# Patient Record
Sex: Female | Born: 2000
Health system: Southern US, Community
[De-identification: ages and names within clinical notes are randomized; demographics above are authoritative.]

## PROBLEM LIST (undated history)

## (undated) DIAGNOSIS — F419 Anxiety disorder, unspecified: Secondary | ICD-10-CM

## (undated) DIAGNOSIS — F938 Other childhood emotional disorders: Secondary | ICD-10-CM

## (undated) DIAGNOSIS — F32A Depression, unspecified: Secondary | ICD-10-CM

## (undated) DIAGNOSIS — F329 Major depressive disorder, single episode, unspecified: Secondary | ICD-10-CM

---
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
## 2018-04-12 DIAGNOSIS — Z3202 Encounter for pregnancy test, result negative: Secondary | ICD-10-CM | POA: Diagnosis not present

## 2018-04-12 DIAGNOSIS — Z01419 Encounter for gynecological examination (general) (routine) without abnormal findings: Secondary | ICD-10-CM | POA: Diagnosis not present

## 2018-04-12 DIAGNOSIS — Z6821 Body mass index (BMI) 21.0-21.9, adult: Secondary | ICD-10-CM | POA: Diagnosis not present

## 2018-04-12 DIAGNOSIS — Z113 Encounter for screening for infections with a predominantly sexual mode of transmission: Secondary | ICD-10-CM | POA: Diagnosis not present

## 2018-04-20 DIAGNOSIS — F902 Attention-deficit hyperactivity disorder, combined type: Secondary | ICD-10-CM | POA: Diagnosis not present

## 2018-04-20 DIAGNOSIS — F3175 Bipolar disorder, in partial remission, most recent episode depressed: Secondary | ICD-10-CM | POA: Diagnosis not present

## 2018-04-24 DIAGNOSIS — A549 Gonococcal infection, unspecified: Secondary | ICD-10-CM | POA: Diagnosis not present

## 2018-07-25 DIAGNOSIS — Z8619 Personal history of other infectious and parasitic diseases: Secondary | ICD-10-CM | POA: Diagnosis not present

## 2018-07-25 DIAGNOSIS — Z113 Encounter for screening for infections with a predominantly sexual mode of transmission: Secondary | ICD-10-CM | POA: Diagnosis not present

## 2018-07-25 DIAGNOSIS — N760 Acute vaginitis: Secondary | ICD-10-CM | POA: Diagnosis not present

## 2018-08-07 ENCOUNTER — Ambulatory Visit (INDEPENDENT_AMBULATORY_CARE_PROVIDER_SITE_OTHER): Payer: BC Managed Care – PPO | Admitting: Family Medicine

## 2018-08-07 ENCOUNTER — Other Ambulatory Visit: Payer: Self-pay

## 2018-08-07 VITALS — BP 129/86 | HR 76 | Temp 98.6°F | Ht 66.5 in | Wt 134.8 lb

## 2018-08-07 DIAGNOSIS — B070 Plantar wart: Secondary | ICD-10-CM | POA: Diagnosis not present

## 2018-08-07 NOTE — Progress Notes (Signed)
Acute Office Visit  Subjective:    Patient ID: Regina Contreras, female    DOB: September 22, 2000, 18 y.o.   MRN: [id number]  Chief Complaint  Patient presents with  . Plantar Warts    left foot    HPI Patient is in today for plantar wart on the left foot. No swelling. Pain with walking. Pt noted over the last 2 weeks. 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 No prior warts  Past Medical History:  Diagnosis Date  . Anxiety   . 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 Depressed     No past surgical history on file.  Family History  Problem Relation Age of Onset  . Hypertension Mother   . Cancer Maternal Grandfather        Leukemia/lymphoma    Social History   Socioeconomic History  . Marital status: Single    Spouse name: Not on file  . Number of children: Not on file  . Years of education: Not on file  . Highest education level: Not on file  Occupational History  . Not on file  Social Needs  . Financial resource strain: Not on file  . Food insecurity    Worry: Not on file    Inability: Not on file  . Transportation needs    Medical: Not on file    Non-medical: Not on file  Tobacco Use  . Smoking status: Never Smoker  . Smokeless tobacco: Never Used  Substance and Sexual Activity  . Alcohol use: No  . Drug use: No  . Sexual activity: Never    Birth control/protection: None  Lifestyle  . Physical activity    Days per week: Not on file    Minutes per session: Not on file  . Stress: Not on file  Relationships  . Social Herbalist on phone: Not on file    Gets together: Not on file    Attends religious service: Not on file    Active member of club or organization: Not on file    Attends meetings of clubs or organizations: Not on file    Relationship status: Not on file  . Intimate partner violence    Fear of current or ex partner: Not on file    Emotionally abused: Not on file    Physically abused: Not on file    Forced sexual  activity: Not on file  Other Topics Concern  . Not on file  Social History Narrative  . Not on file    Outpatient Medications Prior to Visit  Medication Sig Dispense Refill  . norelgestromin-ethinyl estradiol Marilu Favre) 150-35 MCG/24HR transdermal patch Place 1 patch onto the skin once a week.    Marland Kitchen ibuprofen (ADVIL,MOTRIN) 600 MG tablet Take 1 tablet (600 mg total) by mouth every 8 (eight) hours as needed. (Patient not taking: Reported on 05/08/2017) 30 tablet 0  . lamoTRIgine (LAMICTAL) 200 MG tablet Take 1 tablet (200 mg total) by mouth daily. 30 tablet 0  . methylphenidate 36 MG PO CR tablet Take 54 mg by mouth daily.     . cyclobenzaprine (FLEXERIL) 5 MG tablet Take 1 tablet (5 mg total) by mouth 3 (three) times daily as needed for muscle spasms. (Patient not taking: Reported on 05/08/2017) 30 tablet 1  . hydrOXYzine (ATARAX/VISTARIL) 25 MG tablet Take 25 mg by mouth at bedtime.    Marland Kitchen REXULTI 1 MG TABS TK 1 T PO QHS  2   No facility-administered  medications prior to visit.     Allergies  Allergen Reactions  . Prozac [Fluoxetine Hcl] Other (See Comments)    Negative personality change  . Vyvanse [Lisdexamfetamine Dimesylate] Other (See Comments)    Negative Personality change    ROS Wart left foot-tender    Objective:    Physical Exam  Constitutional: She appears well-developed and well-nourished.  left plantar foot-plantar wart  BP 129/86 (BP Location: Left Arm, Patient Position: Sitting, Cuff Size: Normal)   Pulse 76   Temp 98.6 F (37 C) (Oral)   Ht 5' 6.5" (1.689 m)   Wt 134 lb 12.8 oz (61.1 kg)   SpO2 100%   BMI 21.43 kg/m  Wt Readings from Last 3 Encounters:  08/07/18 134 lb 12.8 oz (61.1 kg) (62 %, Z= 0.31)*  05/08/17 134 lb 9.6 oz (61.1 kg) (67 %, Z= 0.44)*  04/13/17 133 lb 12.8 oz (60.7 kg) (66 %, Z= 0.42)*   * Growth percentiles are based on CDC (Girls, 2-20 Years) data.    Health Maintenance Due  Topic Date Due  . HIV Screening  01/03/2014  .  TETANUS/TDAP  01/03/2018    There are no preventive care reminders to display for this patient.   Lab Results  Component Value Date   TSH 1.490 09/02/2016   Lab Results  Component Value Date   WBC 5.6 09/02/2016   HGB 11.5 09/02/2016   HCT 34.9 09/02/2016   MCV 79 09/02/2016   PLT 258 09/02/2016   Lab Results  Component Value Date   NA 139 09/02/2016   K 3.9 09/02/2016   CO2 24 09/02/2016   GLUCOSE 90 09/02/2016   BUN 11 09/02/2016   CREATININE 0.87 09/02/2016   BILITOT 0.3 09/02/2016   ALKPHOS 78 09/02/2016   AST 18 09/02/2016   ALT 10 09/02/2016   PROT 7.1 09/02/2016   ALBUMIN 4.5 09/02/2016   CALCIUM 9.8 09/02/2016   ANIONGAP 6 11/28/2014   Lab Results  Component Value Date   CHOL 136 09/02/2016   Lab Results  Component Value Date   HDL 68 09/02/2016   Lab Results  Component Value Date   LDLCALC 56 09/02/2016   Lab Results  Component Value Date   TRIG 59 09/02/2016   Lab Results  Component Value Date   CHOLHDL 2.0 09/02/2016   Lab Results  Component Value Date   HGBA1C 5.5 09/02/2016       Assessment & Plan:  1. Plantar wart of left foot D/w pt procedure for removal-pt will wait until job over for the summer for removal. 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 Will schedule appt for removal-understands risk/benefit. Encouraged use of the corn pad until removal to decrease pain    Mat CarneLEIGH , MD

## 2018-08-09 DIAGNOSIS — B070 Plantar wart: Secondary | ICD-10-CM | POA: Insufficient documentation

## 2018-08-13 DIAGNOSIS — Z03818 Encounter for observation for suspected exposure to other biological agents ruled out: Secondary | ICD-10-CM | POA: Diagnosis not present

## 2018-10-08 DIAGNOSIS — B070 Plantar wart: Secondary | ICD-10-CM | POA: Diagnosis not present

## 2018-10-17 DIAGNOSIS — F902 Attention-deficit hyperactivity disorder, combined type: Secondary | ICD-10-CM | POA: Diagnosis not present

## 2018-10-17 DIAGNOSIS — F3175 Bipolar disorder, in partial remission, most recent episode depressed: Secondary | ICD-10-CM | POA: Diagnosis not present

## 2018-11-29 DIAGNOSIS — L03115 Cellulitis of right lower limb: Secondary | ICD-10-CM | POA: Diagnosis not present

## 2018-11-29 DIAGNOSIS — L02415 Cutaneous abscess of right lower limb: Secondary | ICD-10-CM | POA: Diagnosis not present

## 2018-12-03 DIAGNOSIS — L02415 Cutaneous abscess of right lower limb: Secondary | ICD-10-CM | POA: Diagnosis not present

## 2018-12-03 DIAGNOSIS — M79604 Pain in right leg: Secondary | ICD-10-CM | POA: Diagnosis not present

## 2018-12-08 DIAGNOSIS — Z20828 Contact with and (suspected) exposure to other viral communicable diseases: Secondary | ICD-10-CM | POA: Diagnosis not present

## 2020-04-20 ENCOUNTER — Other Ambulatory Visit: Payer: Self-pay

## 2020-04-20 ENCOUNTER — Encounter (HOSPITAL_BASED_OUTPATIENT_CLINIC_OR_DEPARTMENT_OTHER): Payer: Self-pay | Admitting: *Deleted

## 2020-04-20 ENCOUNTER — Observation Stay (HOSPITAL_BASED_OUTPATIENT_CLINIC_OR_DEPARTMENT_OTHER)
Admission: EM | Admit: 2020-04-20 | Discharge: 2020-04-21 | Disposition: A | Discharge status: Home / Self Care | Payer: 59 | Attending: Internal Medicine | Admitting: Internal Medicine

## 2020-04-20 ENCOUNTER — Emergency Department (HOSPITAL_BASED_OUTPATIENT_CLINIC_OR_DEPARTMENT_OTHER): Payer: 59

## 2020-04-20 ENCOUNTER — Emergency Department (HOSPITAL_COMMUNITY): Payer: 59

## 2020-04-20 DIAGNOSIS — K529 Noninfective gastroenteritis and colitis, unspecified: Secondary | ICD-10-CM

## 2020-04-20 DIAGNOSIS — E860 Dehydration: Secondary | ICD-10-CM | POA: Diagnosis not present

## 2020-04-20 DIAGNOSIS — J982 Interstitial emphysema: Secondary | ICD-10-CM | POA: Diagnosis not present

## 2020-04-20 DIAGNOSIS — Z20822 Contact with and (suspected) exposure to covid-19: Secondary | ICD-10-CM | POA: Insufficient documentation

## 2020-04-20 DIAGNOSIS — F17290 Nicotine dependence, other tobacco product, uncomplicated: Secondary | ICD-10-CM | POA: Diagnosis not present

## 2020-04-20 DIAGNOSIS — F1290 Cannabis use, unspecified, uncomplicated: Secondary | ICD-10-CM | POA: Diagnosis present

## 2020-04-20 DIAGNOSIS — Z79899 Other long term (current) drug therapy: Secondary | ICD-10-CM | POA: Insufficient documentation

## 2020-04-20 DIAGNOSIS — R112 Nausea with vomiting, unspecified: Principal | ICD-10-CM | POA: Insufficient documentation

## 2020-04-20 DIAGNOSIS — D72829 Elevated white blood cell count, unspecified: Secondary | ICD-10-CM | POA: Diagnosis present

## 2020-04-20 DIAGNOSIS — D649 Anemia, unspecified: Secondary | ICD-10-CM | POA: Insufficient documentation

## 2020-04-20 DIAGNOSIS — E876 Hypokalemia: Secondary | ICD-10-CM | POA: Diagnosis not present

## 2020-04-20 DIAGNOSIS — F1210 Cannabis abuse, uncomplicated: Secondary | ICD-10-CM | POA: Insufficient documentation

## 2020-04-20 LAB — CBC
HCT: 35.1 % — ABNORMAL LOW (ref 36.0–46.0)
Hemoglobin: 10.8 g/dL — ABNORMAL LOW (ref 12.0–15.0)
MCH: 24 pg — ABNORMAL LOW (ref 26.0–34.0)
MCHC: 30.8 g/dL (ref 30.0–36.0)
MCV: 78 fL — ABNORMAL LOW (ref 80.0–100.0)
Platelets: 452 10*3/uL — ABNORMAL HIGH (ref 150–400)
RBC: 4.5 MIL/uL (ref 3.87–5.11)
RDW: 16.2 % — ABNORMAL HIGH (ref 11.5–15.5)
WBC: 16.8 10*3/uL — ABNORMAL HIGH (ref 4.0–10.5)
nRBC: 0 % (ref 0.0–0.2)

## 2020-04-20 LAB — URINALYSIS, ROUTINE W REFLEX MICROSCOPIC
Bilirubin Urine: NEGATIVE
Glucose, UA: NEGATIVE mg/dL
Hgb urine dipstick: NEGATIVE
Ketones, ur: 15 mg/dL — AB
Leukocytes,Ua: NEGATIVE
Nitrite: NEGATIVE
Protein, ur: 30 mg/dL — AB
Specific Gravity, Urine: 1.023 (ref 1.005–1.030)
pH: 6.5 (ref 5.0–8.0)

## 2020-04-20 LAB — RESP PANEL BY RT-PCR (FLU A&B, COVID) ARPGX2
Influenza A by PCR: NEGATIVE
Influenza B by PCR: NEGATIVE
SARS Coronavirus 2 by RT PCR: NEGATIVE

## 2020-04-20 LAB — RAPID URINE DRUG SCREEN, HOSP PERFORMED
Amphetamines: NOT DETECTED
Barbiturates: NOT DETECTED
Benzodiazepines: NOT DETECTED
Cocaine: NOT DETECTED
Opiates: POSITIVE — AB
Tetrahydrocannabinol: POSITIVE — AB

## 2020-04-20 LAB — COMPREHENSIVE METABOLIC PANEL
ALT: 14 U/L (ref 0–44)
AST: 22 U/L (ref 15–41)
Albumin: 4.7 g/dL (ref 3.5–5.0)
Alkaline Phosphatase: 54 U/L (ref 38–126)
Anion gap: 14 (ref 5–15)
BUN: 13 mg/dL (ref 6–20)
CO2: 21 mmol/L — ABNORMAL LOW (ref 22–32)
Calcium: 9.8 mg/dL (ref 8.9–10.3)
Chloride: 105 mmol/L (ref 98–111)
Creatinine, Ser: 0.89 mg/dL (ref 0.44–1.00)
GFR, Estimated: >60 mL/min (ref >60)
Glucose, Bld: 174 mg/dL — ABNORMAL HIGH (ref 70–99)
Potassium: 3.2 mmol/L — ABNORMAL LOW (ref 3.5–5.1)
Sodium: 140 mmol/L (ref 135–145)
Total Bilirubin: 0.4 mg/dL (ref 0.3–1.2)
Total Protein: 7.7 g/dL (ref 6.5–8.1)

## 2020-04-20 LAB — PREGNANCY, URINE: Preg Test, Ur: NEGATIVE

## 2020-04-20 LAB — LIPASE, BLOOD: Lipase: 11 U/L (ref 11–51)

## 2020-04-20 MED ORDER — ONDANSETRON HCL 4 MG/2ML IJ SOLN
4.0000 mg | Freq: Once | INTRAMUSCULAR | Status: DC | PRN
  Filled 2020-04-20: qty 2

## 2020-04-20 MED ORDER — HYDROMORPHONE HCL 1 MG/ML IJ SOLN
0.5000 mg | Freq: Once | INTRAMUSCULAR | Status: AC
  Administered 2020-04-20: 0.5 mg via INTRAVENOUS
  Filled 2020-04-20: qty 1

## 2020-04-20 MED ORDER — ONDANSETRON HCL 4 MG/2ML IJ SOLN
4.0000 mg | Freq: Once | INTRAMUSCULAR | Status: AC
  Filled 2020-04-20: qty 2

## 2020-04-20 MED ORDER — HYDROMORPHONE HCL 1 MG/ML IJ SOLN: 1.0000 mg | Freq: Once | INTRAMUSCULAR | Status: DC

## 2020-04-20 MED ORDER — IOHEXOL 350 MG/ML SOLN
50.0000 mL | Freq: Once | INTRAVENOUS | Status: AC | PRN
  Administered 2020-04-20: 50 mL via ORAL

## 2020-04-20 MED ORDER — ALUM & MAG HYDROXIDE-SIMETH 200-200-20 MG/5ML PO SUSP
30.0000 mL | Freq: Once | ORAL | Status: AC
  Administered 2020-04-20: 30 mL via ORAL
  Filled 2020-04-20: qty 30

## 2020-04-20 MED ORDER — ONDANSETRON HCL 4 MG/2ML IJ SOLN
INTRAMUSCULAR | Status: AC
  Administered 2020-04-20: 4 mg
  Filled 2020-04-20: qty 2

## 2020-04-20 MED ORDER — SODIUM CHLORIDE 0.9 % IV SOLN
1.0000 g | Freq: Once | INTRAVENOUS | Status: AC
  Administered 2020-04-20: 1 g via INTRAVENOUS
  Filled 2020-04-20: qty 10

## 2020-04-20 MED ORDER — PANTOPRAZOLE SODIUM 40 MG IV SOLR
40.0000 mg | Freq: Once | INTRAVENOUS | Status: AC
  Administered 2020-04-20: 40 mg via INTRAVENOUS
  Filled 2020-04-20: qty 40

## 2020-04-20 MED ORDER — SODIUM CHLORIDE 0.9 % IV SOLN: INTRAVENOUS | Status: DC

## 2020-04-20 MED ORDER — IOHEXOL 300 MG/ML  SOLN
80.0000 mL | Freq: Once | INTRAMUSCULAR | Status: AC | PRN
  Administered 2020-04-20: 80 mL via INTRAVENOUS

## 2020-04-20 MED ORDER — LIDOCAINE VISCOUS HCL 2 % MT SOLN
15.0000 mL | Freq: Once | OROMUCOSAL | Status: AC
  Administered 2020-04-20: 15 mL via ORAL
  Filled 2020-04-20: qty 15

## 2020-04-20 MED ORDER — SODIUM CHLORIDE 0.9 % IV BOLUS
1000.0000 mL | Freq: Once | INTRAVENOUS | Status: AC
  Administered 2020-04-20: 1000 mL via INTRAVENOUS

## 2020-04-20 NOTE — H&P (Signed)
Regina Contreras ZOX:[id number] DOB: Dec 08, 2000 DOA: 04/20/2020    PCP: Porfirio OarJeffery, Chelle, PA   Outpatient Specialists: NONE    Patient arrived to ER on 04/20/20 at 1437 Referred by Attending Tegeler, Canary Brimhristopher J, *   Patient coming from: home Lives  With family    Chief Complaint:  Chief Complaint  Patient presents with  . Emesis  . Abdominal Pain    HPI: Regina Contreras is a 20 y.o. female with medical history significant of anxiety    Presented with abdominal pain which is somewhat diffuse.  Nausea and severe vomiting also diarrhea started at 11:30 AM lasting until 3PM severe dry heaves. 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 Was seen at [facility] noted to have vital signs stable but CT scan showed small amount of pneumomediastinum at GE junction General surgery was consulted [facility]  She vapes and smokes marijuana    Has  been vaccinated against COVID and boosted   Initial COVID TEST  NEGATIVE   Lab Results  Component Value Date   SARSCOV2NAA NEGATIVE 04/20/2020      While in ER: Initial CT showed pneumomediastinum  Was started on rocephine Esophagram showed no esophageal tear   ED Triage Vitals  Enc Vitals Group     BP 04/20/20 1445 (!) 162/89     Pulse Rate 04/20/20 1443 71     Resp 04/20/20 1443 16     Temp 04/20/20 1443 (!) 96.9 F (36.1 C)     Temp Source 04/20/20 1443 Oral     SpO2 04/20/20 1443 100 %     Weight 04/20/20 1444 125 lb (56.7 kg)     Height 04/20/20 1444 5\' 6"  (1.676 m)     Head Circumference --      Peak Flow --      Pain Score 04/20/20 1443 3     Pain Loc --      Pain Edu? --      Excl. in GC? --   TMAX(24)@     _________________________________________ Significant initial  Findings: Abnormal Labs Reviewed  COMPREHENSIVE METABOLIC PANEL - Abnormal;  Notable for the following components:      Result Value   Potassium 3.2 (*)    CO2 21 (*)    Glucose, Bld 174 (*)    All other components within normal limits  CBC - Abnormal; Notable for the following components:   WBC 16.8 (*)    Hemoglobin 10.8 (*)    HCT 35.1 (*)    MCV 78.0 (*)    MCH 24.0 (*)    RDW 16.2 (*)    Platelets 452 (*)    All other components within normal limits  URINALYSIS, ROUTINE W REFLEX MICROSCOPIC - Abnormal; Notable for the following components:   Ketones, ur 15 (*)    Protein, ur 30 (*)    All other components within normal limits  RAPID URINE DRUG SCREEN, HOSP PERFORMED - Abnormal; Notable for the following components:   Opiates POSITIVE (*)    Tetrahydrocannabinol POSITIVE (*)    All other components within normal limits    CTabd/pelvis -  pneumomediastinum    ECG: Ordered     The recent clinical data is shown below. Vitals:   04/20/20 1915 04/20/20 2048 04/20/20 2130 04/20/20 2220  BP: 127/83 140/84 131/72 135/86  Pulse: 61 61 (!) 57 (!) 57  Resp: 18 12 20 15   Temp:      TempSrc:      SpO2: 100% 100% 100% 100%  Weight:      Height:       WBC     Component Value Date/Time   WBC 16.8 (H) 04/20/2020 1452   LYMPHSABS 2.1 09/02/2016 1139   MONOABS 0.7 11/28/2014 1711   EOSABS 0.1 09/02/2016 1139   BASOSABS 0.0 09/02/2016 1139     UA  no evidence of UTI     Urine analysis:    Component Value Date/Time   COLORURINE YELLOW 04/20/2020 1606   APPEARANCEUR CLEAR 04/20/2020 1606   LABSPEC 1.023 04/20/2020 1606   PHURINE 6.5 04/20/2020 1606   GLUCOSEU NEGATIVE 04/20/2020 1606   HGBUR NEGATIVE 04/20/2020 1606   BILIRUBINUR NEGATIVE 04/20/2020 1606   KETONESUR 15 (A) 04/20/2020 1606   PROTEINUR 30 (A) 04/20/2020 1606   NITRITE NEGATIVE 04/20/2020 1606   LEUKOCYTESUR NEGATIVE 04/20/2020 1606    Results for orders placed or performed during the hospital encounter of 04/20/20  Resp Panel by RT-PCR (Flu A&B, Covid) Nasopharyngeal Swab      Status: None   Collection Time: 04/20/20  7:19 PM   Specimen: Nasopharyngeal Swab; Nasopharyngeal(NP) swabs in vial transport medium  Result Value Ref Range Status   SARS Coronavirus 2 by RT PCR NEGATIVE NEGATIVE Final         Influenza A by PCR NEGATIVE NEGATIVE Final   Influenza B by PCR NEGATIVE NEGATIVE Final          _______________________________________________________ ER Provider Called:   general surgery   Dr.Wakefield   They Recommend admit to medicine    _______________________________________________ Hospitalist was called for admission for pneumomediastinum dehydrtion  The following Work up has been ordered so far:  Orders Placed This Encounter  Procedures  . Resp Panel by RT-PCR (Flu A&B, Covid) Nasopharyngeal Swab  . CT Abdomen Pelvis W Contrast  . DG ESOPHAGUS W SINGLE CM (SOL OR THIN BA)  . Lipase, blood  . Comprehensive metabolic panel  . CBC  . Urinalysis, Routine w reflex microscopic  . Pregnancy, urine  . Rapid urine drug screen (hospital performed)  . Diet NPO time specified  . Secure IV for POV transfer  . Consult to general surgery  . Consult to hospitalist  . Consult to hospitalist     Following Medications were ordered in ER: Medications  0.9 %  sodium chloride infusion ( Intravenous New Bag/Given 04/20/20 1506)  0.9 %  sodium chloride infusion ( Intravenous New Bag/Given 04/20/20 2118)  ondansetron (ZOFRAN) 4 MG/2ML injection (4 mg  Given 04/20/20 1452)  sodium chloride 0.9 % bolus 1,000 mL (0 mLs Intravenous Stopped 04/20/20 1604)  HYDROmorphone (DILAUDID) injection 0.5 mg (0.5 mg Intravenous Given 04/20/20 1502)  alum & mag hydroxide-simeth (MAALOX/MYLANTA) 200-200-20 MG/5ML suspension 30 mL (30 mLs Oral Given 04/20/20 1620)    And  lidocaine (XYLOCAINE) 2 % viscous mouth solution 15 mL (15 mLs Oral Given 04/20/20 1620)  iohexol (OMNIPAQUE) 300 MG/ML solution 80 mL (80 mLs Intravenous Contrast Given 04/20/20 1647)  pantoprazole (PROTONIX) injection 40 mg  (40 mg Intravenous Given 04/20/20 2101)  cefTRIAXone (ROCEPHIN) 1 g in sodium chloride 0.9 % 100 mL IVPB (0 g Intravenous Stopped 04/20/20 2234)  HYDROmorphone (DILAUDID) injection 0.5 mg (0.5 mg Intravenous Given 04/20/20 2141)  iohexol (OMNIPAQUE) 350 MG/ML injection 50 mL (50 mLs Oral Contrast Given 04/20/20 2231)  Consult Orders  (From admission, onward)         Start     Ordered   04/20/20 2254  Consult to hospitalist  Once       Provider:  (Not yet assigned)  Question Answer Comment  Place call to: [facility]   Reason for Consult Admit      04/20/20 2253   04/20/20 1805  Consult to hospitalist  Spoke to Phil at [facility] [place] to repage [phone]  Once       Provider:  (Not yet assigned)  Question Answer Comment  Place call to: [facility]   Reason for Consult Admit      04/20/20 1804            OTHER Significant initial  Findings:  labs showing:    Recent Labs  Lab 04/20/20 1452  NA 140  K 3.2*  CO2 21*  GLUCOSE 174*  BUN 13  CREATININE 0.89  CALCIUM 9.8    Cr   stable,    Lab Results  Component Value Date   CREATININE 0.89 04/20/2020   CREATININE 0.87 09/02/2016   CREATININE 1.00 11/28/2014    Recent Labs  Lab 04/20/20 1452  AST 22  ALT 14  ALKPHOS 54  BILITOT 0.4  PROT 7.7  ALBUMIN 4.7   Lab Results  Component Value Date   CALCIUM 9.8 04/20/2020       Plt: Lab Results  Component Value Date   PLT 452 (H) 04/20/2020       COVID-19 Labs  No results for input(s): DDIMER, FERRITIN, LDH, CRP in the last 72 hours.  Lab Results  Component Value Date   SARSCOV2NAA NEGATIVE 04/20/2020       Recent Labs  Lab 04/20/20 1452  WBC 16.8*  HGB 10.8*  HCT 35.1*  MCV 78.0*  PLT 452*    HG/HCT   stable,      Component Value Date/Time   HGB 10.8 (L) 04/20/2020 1452   HGB 11.5 09/02/2016 1139   HCT 35.1 (L) 04/20/2020 1452   HCT 34.9 09/02/2016 1139   MCV 78.0 (L) 04/20/2020 1452   MCV 79 09/02/2016 1139       Recent Labs  Lab 04/20/20 1452  LIPASE 11      Cardiac Panel (last 3 results) No results for input(s): CKTOTAL, CKMB, TROPONINI, RELINDX in the last 72 hours.   BNP (last 3 results) No results for input(s): BNP in the last 8760 hours.     Cultures: No results found for: SDES, SPECREQUEST, CULT, REPTSTATUS   Radiological Exams on Admission: CT Abdomen Pelvis W Contrast  Result Date: 04/20/2020 CLINICAL DATA:  Nausea vomiting abdominal pain EXAM: CT ABDOMEN AND PELVIS WITH CONTRAST TECHNIQUE: Multidetector CT imaging of the abdomen and pelvis was performed using the standard protocol following bolus administration of intravenous contrast. CONTRAST:  80mL OMNIPAQUE IOHEXOL 300 MG/ML  SOLN COMPARISON:  None. FINDINGS: Lower chest: The visualized heart size within normal limits. No pericardial fluid/thickening. There is a tiny amount of pneumomediastinum seen at the posterior right lung base and surrounding the distal GE junction and anterior aorta. There appears to be wall thickening seen at the distal esophagus/GE junction. No definite loculated fluid collections are noted. Hepatobiliary: The liver is normal in density without focal abnormality.The main portal vein is patent. No evidence of calcified gallstones, gallbladder wall thickening or biliary dilatation. Pancreas: Unremarkable. No pancreatic ductal dilatation or surrounding inflammatory changes. Spleen: Normal in size without  focal abnormality. Adrenals/Urinary Tract: Both adrenal glands appear normal. The kidneys and collecting system appear normal without evidence of urinary tract calculus or hydronephrosis. Bladder is unremarkable. Stomach/Bowel: The stomach, small bowel, and colon are normal in appearance. No inflammatory changes, wall thickening, or obstructive findings.The appendix is normal. Vascular/Lymphatic: There are no enlarged mesenteric, retroperitoneal, or pelvic lymph nodes. No significant vascular findings are  present. Reproductive: The uterus and adnexa are unremarkable. A small amount of free fluid seen within the deep cul-de-sac. Other: No evidence of abdominal wall mass or hernia. Musculoskeletal: No acute or significant osseous findings. IMPRESSION: Small amount of pneumomediastinum seen at the right posterior lung base surrounding the GE junction with diffuse wall thickening at the GE junction which could be due to esophagitis. No loculated fluid collections are noted. These results were called by telephone at the time of interpretation on 04/20/2020 at 5:31 pm to provider Dr. Stevie Kern, who verbally acknowledged these results. Electronically Signed   By: Jonna Clark M.D.   On: 04/20/2020 17:34   DG ESOPHAGUS W SINGLE CM (SOL OR THIN BA)  Result Date: 04/20/2020 CLINICAL DATA:  Vomiting today with abdominal pain and soreness. Pneumo mediastinum was demonstrated on CT abdomen and pelvis. EXAM: ESOPHOGRAM/BARIUM SWALLOW TECHNIQUE: Single contrast examination was performed using Omnipaque water-soluble contrast material. FLUOROSCOPY TIME:  Fluoroscopy Time:  1 minutes 0 seconds Radiation Exposure Index (if provided by the fluoroscopic device): 16.1 mGy Number of Acquired Spot Images: 10 COMPARISON:  CT abdomen and pelvis 04/20/2020 FINDINGS: Scout views obtained demonstrate pneumoperitoneum with soft tissue gas extending throughout the neck and upper mediastinum. Mediastinal contours appear intact. Fluoroscopic visualization with cine images obtained during active swallowing of contrast material in the upright AP and oblique positions. Normal initiation of swallowing with free flow of contrast material through the esophagus and into the stomach. The esophageal wall is regular in the esophageal folds are normal. Normal primary peristalsis with normal stripping wave and good clearance of contrast material out of the esophagus. No contrast extravasation was demonstrated. Delayed images demonstrated no residual contrast  material in the esophagus and no extraluminal pooling. Limited visualization of the stomach without distention. IMPRESSION: 1. Pneumo mediastinum. 2. Normal esophagram.  No evidence of contrast extravasation. Electronically Signed   By: Burman Nieves M.D.   On: 04/20/2020 22:47   _______________________________________________________________________________________________________ Latest  Blood pressure 135/86, pulse (!) 57, temperature (!) 96.9 F (36.1 C), temperature source Oral, resp. rate 15, height 5\' 6"  (1.676 m), weight 55.4 kg, SpO2 100 %.   Review of Systems:    Pertinent positives include:  abdominal pain, nausea, vomiting, diarrhea,  Constitutional:  No weight loss, night sweats, Fevers, chills, fatigue, weight loss  HEENT:  No headaches, Difficulty swallowing,Tooth/dental problems,Sore throat,  No sneezing, itching, ear ache, nasal congestion, post nasal drip,  Cardio-vascular:  No chest pain, Orthopnea, PND, anasarca, dizziness, palpitations.no Bilateral lower extremity swelling  GI:  No heartburn, indigestion, change in bowel habits, loss of appetite, melena, blood in stool, hematemesis Resp:  no shortness of breath at rest. No dyspnea on exertion, No excess mucus, no productive cough, No non-productive cough, No coughing up of blood.No change in color of mucus.No wheezing. Skin:  no rash or lesions. No jaundice GU:  no dysuria, change in color of urine, no urgency or frequency. No straining to urinate.  No flank pain.  Musculoskeletal:  No joint pain or no joint swelling. No decreased range of motion. No back pain.  Psych:  No change in mood  or affect. No depression or anxiety. No memory loss.  Neuro: no localizing neurological complaints, no tingling, no weakness, no double vision, no gait abnormality, no slurred speech, no confusion  All systems reviewed and apart from HOPI all are  negative _______________________________________________________________________________________________ Past Medical History:   Past Medical History:  Diagnosis Date  . Anxiety   . 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 Depressed       History reviewed. No pertinent surgical history.  Social History:  Ambulatory  Independently     reports that she has never smoked. She has never used smokeless tobacco. She reports current drug use. Drug: Marijuana. She reports that she does not drink alcohol.     Family History:   Family History  Problem Relation Age of Onset  . Hypertension Mother   . Cancer Maternal Grandfather        Leukemia/lymphoma   ______________________________________________________________________________________________ Allergies: Allergies  Allergen Reactions  . Prozac [Fluoxetine Hcl] Other (See Comments)    Negative personality change  . Vyvanse [Lisdexamfetamine Dimesylate] Other (See Comments)    Negative Personality change     Prior to Admission medications   Medication Sig Start Date End Date Taking? Authorizing Provider  calcium carbonate (TUMS - DOSED IN MG ELEMENTAL CALCIUM) 500 MG chewable tablet Chew 3 tablets by mouth daily as needed for indigestion or heartburn.   Yes [provider]  CONCERTA 54 MG CR tablet Take 54 mg by mouth every morning. 04/15/20  Yes [provider]  lamoTRIgine (LAMICTAL) 150 MG tablet Take 150 mg by mouth daily. 01/06/20  Yes [provider]  Norelgestromin-Eth Estradiol Burr Medico TD) Place 1 patch onto the skin every Tuesday.   Yes [provider]  pantoprazole (PROTONIX) 40 MG tablet Take 40 mg by mouth daily. 11/04/19  Yes [provider]    ___________________________________________________________________________________________________ Physical Exam: Vitals with BMI 04/20/2020 04/20/2020 04/20/2020  Height - - -  Weight - - -  BMI - - -  Systolic 135 131 440   Diastolic 86 72 84  Pulse 57 57 61  Some encounter information is confidential and restricted. Go to Review Flowsheets activity to see all data.     1. General:  in No  Acute distress   Chronically ill   -appearing 2. Psychological: Alert and Oriented 3. Head/ENT:    Dry Mucous Membranes                          Head Non traumatic, neck supple                          Poor Dentition 4. SKIN:  decreased Skin turgor,  Skin clean Dry and intact no rash 5. Heart: Regular rate and rhythm no  Murmur, no Rub or gallop 6. Lungs:  no wheezes or crackles   7. Abdomen: Soft,  non-tender, Non distended  bowel sounds present 8. Lower extremities: no clubbing, cyanosis, no edema 9. Neurologically Grossly intact, moving all 4 extremities equally   10. MSK: Normal range of motion    Chart has been reviewed  ______________________________________________________________________________________________  Assessment/Plan  20 y.o. female with medical history significant of anxiety     Admitted for pneumomediastinum dehydration  Present on Admission: . Pneumomediastinum (HCC) in the setting of severe retching.  Upper GI study showed no evidence of perforation.  Empirically started antibiotics Rocephin will continue for tonight pain management and symptom control  .  Hypokalemia -replace and follow check magnesium level  . Intractable nausea and vomiting supportive management obtain gastric panel given patient reports some diarrhea as well  . Marijuana use -could be contributing to tractable nausea and vomiting spoke at length regarding discontinued use  . Leukocytosis most likely reactive  . Dehydration -rehydrate and follow fluid status  . Anemia denies heavy menstrual periods obtain anemia panel follow-up CBC  Other plan as per orders.  DVT prophylaxis:  SCD      Code Status:    Code Status: Prior FULL CODE   per patient   I had personally discussed CODE STATUS with patient    Family  Communication:   Family   at  Bedside  plan of care was discussed on the phone with  mother  Disposition Plan:        To home once workup is complete and patient is stable   Following barriers for discharge:                            Electrolytes corrected                               Anemia stable                             Pain controlled with PO medications                               white count improving able to transition to PO antibiotics                             Will need to be able to tolerate PO                                               Consults called: general surgery is aware   Admission status:  ED Disposition    ED Disposition Condition Comment   Admit  Hospital Area: [facility] [[id number]]  Level of Care: Progressive [102]  Admit to Progressive based on following criteria: CARDIOVASCULAR & THORACIC of moderate stability with acute coronary syndrome symptoms/low risk myocardial infarction/hypertensive urgency/arrhythmias/heart failure potentially compromising stability and stable post cardiovascular intervention patients.  Covid Evaluation: Confirmed COVID Negative  Diagnosis: Pneumomediastinum [facility]) [[id number]]  Admitting Physician: Therisa Doyne [[id number]]  Attending Physician: Therisa Doyne [[id number]]        Obs   Level of care progressive  tele indefinitely please discontinue once patient no longer qualifies COVID-19 Labs    Lab Results  Component Value Date   SARSCOV2NAA NEGATIVE 04/20/2020     Precautions: admitted as   Covid Negative    PPE: Used by the provider:   N95  eye Goggles,  Gloves     D'Arcy Abraha 04/21/2020, 1:38 AM    [facility]     after 2 AM please page floor coverage PA If 7AM-7PM, please contact the day team taking care of the patient using [email or web address]   Patient was evaluated in the context of the global COVID-19 pandemic, which necessitated consideration that the patient might be at risk  for infection with the SARS-CoV-2 virus that causes COVID-19. Institutional protocols and algorithms that pertain to the evaluation of patients at risk for COVID-19 are in a state of rapid change based on information released by regulatory bodies including the CDC and federal and state organizations. These policies and algorithms were followed during the patient's care.

## 2020-04-20 NOTE — ED Provider Notes (Addendum)
Signout note  20 year old presents to ER with concern for nausea vomiting and abdominal pain.  Labs noted for leukocytosis.  CT ordered to further assess.  3:00 PM Received signout from Dr. Deretha Emory  6:05 PM CT scan with small amount of pneumomediastinum at base of GE junction, diffuse wall thickening at GE junction.  Reassessed patient, she still has some residual pain but is comfortable, no ongoing nausea or vomiting.  Vitals are stable and afebrile.  Patient's abdomen is soft, there is no rebound or guarding. Discussed case with on-call for general surgery, Dr. Dwain Sarna.  He recommends obtaining upper GI swallow study to look for perforation, admitting to medicine for observation at [facility].  Further recommendations pending results of further imaging.  Will consult hospitalist service at [facility]. Pt agreeable. Mother at bedside and pt updated.   7:09 PM discussed case with Dr. Adela Glimpse, there likely would not be an appropriate bed available for patient at [facility] until tomorrow morning; contacted radiology, recommended obtaining esophagram to rule out perforation, this could be completed this evening.  Will transfer patient ER to ER so that further testing can be expedited.  Discussed case with Dr. Clarice Pole at [facility] who accepts as ER transfer. Once patient has completed esophagram, would discuss further with general surgery.  Recommended transport via [facility] however patient and mother insistent to go POV. Discussed risks/benefits and they demonstrated my concerns and have decided to go POV.  Pt currently appears well with stable vitals.   CRITICAL CARE Performed by: Milagros Loll  Total critical care time: 48 minutes  Critical care time was exclusive of separately billable procedures and treating other patients.  Critical care was necessary to treat or prevent imminent or life-threatening deterioration.  Critical care was time spent personally by me on the following  activities: development of treatment plan with patient and/or surrogate as well as nursing, discussions with consultants, evaluation of patient's response to treatment, examination of patient, obtaining history from patient or surrogate, ordering and performing treatments and interventions, ordering and review of laboratory studies, ordering and review of radiographic studies, pulse oximetry and re-evaluation of patient's condition.    Milagros Loll, MD 04/20/20 1807    Milagros Loll, MD 04/20/20 Merrily Brittle    Milagros Loll, MD 04/20/20 [phone]

## 2020-04-20 NOTE — H&P (Incomplete)
Regina Contreras UJW:[id number] DOB: December 25, 2000 DOA: 04/20/2020    PCP: Porfirio Oar, PA   Outpatient Specialists: NONE    Patient arrived to ER on 04/20/20 at 1437 Referred by Attending Tegeler, Canary Brim, *   Patient coming from: home Lives  With family    Chief Complaint:  Chief Complaint  Patient presents with  . Emesis  . Abdominal Pain    HPI: Regina Contreras is a 20 y.o. female with medical history significant of anxiety    Presented with abdominal pain which is somewhat diffuse.  Nausea and severe vomiting also diarrhea started at 11:30 AM lasting until 3PM severe dry heaves. 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 Was seen at [facility] noted to have vital signs stable but CT scan showed small amount of pneumomediastinum at GE junction General surgery was consulted recommended transfer to [facility] for upper GI swallow study  She vapes and smokes marijuana    Has  been vaccinated against COVID and boosted   Initial COVID TEST  NEGATIVE   Lab Results  Component Value Date   SARSCOV2NAA NEGATIVE 04/20/2020      While in ER: Initial CT showed pneumomediastinum  Was started on rocephine Esophagram showed no esophageal tear   ED Triage Vitals  Enc Vitals Group     BP 04/20/20 1445 (!) 162/89     Pulse Rate 04/20/20 1443 71     Resp 04/20/20 1443 16     Temp 04/20/20 1443 (!) 96.9 F (36.1 C)     Temp Source 04/20/20 1443 Oral     SpO2 04/20/20 1443 100 %     Weight 04/20/20 1444 125 lb (56.7 kg)     Height 04/20/20 1444  (1.676 m)     Head Circumference --      Peak Flow --      Pain Score 04/20/20 1443 3     Pain Loc --      Pain Edu? --      Excl. in GC? --   TMAX(24)@     _________________________________________ Significant initial  Findings: Abnormal Labs Reviewed  COMPREHENSIVE METABOLIC PANEL - Abnormal;  Notable for the following components:      Result Value   Potassium 3.2 (*)    CO2 21 (*)    Glucose, Bld 174 (*)    All other components within normal limits  CBC - Abnormal; Notable for the following components:   WBC 16.8 (*)    Hemoglobin 10.8 (*)    HCT 35.1 (*)    MCV 78.0 (*)    MCH 24.0 (*)    RDW 16.2 (*)    Platelets 452 (*)    All other components within normal limits  URINALYSIS, ROUTINE W REFLEX MICROSCOPIC - Abnormal; Notable for the following components:   Ketones, ur 15 (*)    Protein, ur 30 (*)    All other components within normal limits  RAPID URINE DRUG SCREEN, HOSP PERFORMED - Abnormal; Notable for the following components:   Opiates POSITIVE (*)    Tetrahydrocannabinol POSITIVE (*)    All other components within normal limits   ____________________________________________    CTabd/pelvis -  pneumomediastinum    ECG: Ordered  Personally reviewed by me showing: HR : *** Rhythm: *NSR, Sinus tachycardia * A.fib. W RVR, RBBB, LBBB, Paced Ischemic changes*nonspecific changes, no evidence of  ischemic changes QTC*    The recent clinical data is shown below. Vitals:   04/20/20 1915 04/20/20 2048 04/20/20 2130 04/20/20 2220  BP: 127/83 140/84 131/72 135/86  Pulse: 61 61 (!) 57 (!) 57  Resp: 18 12 20 15   Temp:      TempSrc:      SpO2: 100% 100% 100% 100%  Weight:      Height:       WBC     Component Value Date/Time   WBC 16.8 (H) 04/20/2020 1452   LYMPHSABS 2.1 09/02/2016 1139   MONOABS 0.7 11/28/2014 1711   EOSABS 0.1 09/02/2016 1139   BASOSABS 0.0 09/02/2016 1139     UA  no evidence of UTI     Urine analysis:    Component Value Date/Time   COLORURINE YELLOW 04/20/2020 1606   APPEARANCEUR CLEAR 04/20/2020 1606   LABSPEC 1.023 04/20/2020 1606   PHURINE 6.5 04/20/2020 1606   GLUCOSEU NEGATIVE 04/20/2020 1606   HGBUR NEGATIVE 04/20/2020 1606   BILIRUBINUR NEGATIVE 04/20/2020 1606   KETONESUR 15 (A) 04/20/2020 1606   PROTEINUR 30 (A)  04/20/2020 1606   NITRITE NEGATIVE 04/20/2020 1606   LEUKOCYTESUR NEGATIVE 04/20/2020 1606    Results for orders placed or performed during the hospital encounter of 04/20/20  Resp Panel by RT-PCR (Flu A&B, Covid) Nasopharyngeal Swab     Status: None   Collection Time: 04/20/20  7:19 PM   Specimen: Nasopharyngeal Swab; Nasopharyngeal(NP) swabs in vial transport medium  Result Value Ref Range Status   SARS Coronavirus 2 by RT PCR NEGATIVE NEGATIVE Final         Influenza A by PCR NEGATIVE NEGATIVE Final   Influenza B by PCR NEGATIVE NEGATIVE Final           _______________________________________________________ ER Provider Called:   general surgery   Dr.Wkefield   They Recommend admit to medicine    _______________________________________________ Hospitalist was called for admission for pneumomediastinum dehydrtion  The following Work up has been ordered so far:  Orders Placed This Encounter  Procedures  . Resp Panel by RT-PCR (Flu A&B, Covid) Nasopharyngeal Swab  . CT Abdomen Pelvis W Contrast  . DG ESOPHAGUS W SINGLE CM (SOL OR THIN BA)  . Lipase, blood  . Comprehensive metabolic panel  . CBC  . Urinalysis, Routine w reflex microscopic  . Pregnancy, urine  . Rapid urine drug screen (hospital performed)  . Diet NPO time specified  . Secure IV for POV transfer  . Consult to general surgery  . Consult to hospitalist  . Consult to hospitalist     Following Medications were ordered in ER: Medications  0.9 %  sodium chloride infusion ( Intravenous New Bag/Given 04/20/20 1506)  0.9 %  sodium chloride infusion ( Intravenous New Bag/Given 04/20/20 2118)  ondansetron (ZOFRAN) 4 MG/2ML injection (4 mg  Given 04/20/20 1452)  sodium chloride 0.9 % bolus 1,000 mL (0 mLs Intravenous Stopped 04/20/20 1604)  HYDROmorphone (DILAUDID) injection 0.5 mg (0.5 mg Intravenous Given 04/20/20 1502)  alum & mag hydroxide-simeth (MAALOX/MYLANTA) 200-200-20 MG/5ML suspension 30 mL (30 mLs Oral Given  04/20/20 1620)    And  lidocaine (XYLOCAINE) 2 % viscous mouth solution 15 mL (15 mLs Oral Given 04/20/20 1620)  iohexol (OMNIPAQUE) 300 MG/ML solution 80 mL (80 mLs Intravenous Contrast Given 04/20/20 1647)  pantoprazole (PROTONIX) injection 40 mg (40 mg Intravenous Given 04/20/20 2101)  cefTRIAXone (ROCEPHIN) 1 g in sodium chloride 0.9 % 100 mL IVPB (0 g Intravenous Stopped  04/20/20 2234)  HYDROmorphone (DILAUDID) injection 0.5 mg (0.5 mg Intravenous Given 04/20/20 2141)  iohexol (OMNIPAQUE) 350 MG/ML injection 50 mL (50 mLs Oral Contrast Given 04/20/20 2231)        Consult Orders  (From admission, onward)         Start     Ordered   04/20/20 2254  Consult to hospitalist  Once       Provider:  (Not yet assigned)  Question Answer Comment  Place call to: [facility]   Reason for Consult Admit      04/20/20 2253   04/20/20 1805  Consult to hospitalist  Spoke to Phil at [facility] to [place] to repage [phone]  Once       Provider:  (Not yet assigned)  Question Answer Comment  Place call to: [facility]   Reason for Consult Admit      04/20/20 1804            OTHER Significant initial  Findings:  labs showing:    Recent Labs  Lab 04/20/20 1452  NA 140  K 3.2*  CO2 21*  GLUCOSE 174*  BUN 13  CREATININE 0.89  CALCIUM 9.8    Cr  * stable,  Up from baseline see below Lab Results  Component Value Date   CREATININE 0.89 04/20/2020   CREATININE 0.87 09/02/2016   CREATININE 1.00 11/28/2014    Recent Labs  Lab 04/20/20 1452  AST 22  ALT 14  ALKPHOS 54  BILITOT 0.4  PROT 7.7  ALBUMIN 4.7   Lab Results  Component Value Date   CALCIUM 9.8 04/20/2020          Plt: Lab Results  Component Value Date   PLT 452 (H) 04/20/2020       COVID-19 Labs  No results for input(s): DDIMER, FERRITIN, LDH, CRP in the last 72 hours.  Lab Results  Component Value Date   SARSCOV2NAA NEGATIVE 04/20/2020     Arterial ***Venous  Blood Gas result:  pH *** pCO2  ***; pO2 ***;     %O2 Sat ***.  ABG No results found for: PHART, PCO2ART, PO2ART, HCO3, TCO2, ACIDBASEDEF, O2SAT       Recent Labs  Lab 04/20/20 1452  WBC 16.8*  HGB 10.8*  HCT 35.1*  MCV 78.0*  PLT 452*    HG/HCT * stable,  Down *Up from baseline see below    Component Value Date/Time   HGB 10.8 (L) 04/20/2020 1452   HGB 11.5 09/02/2016 1139   HCT 35.1 (L) 04/20/2020 1452   HCT 34.9 09/02/2016 1139   MCV 78.0 (L) 04/20/2020 1452   MCV 79 09/02/2016 1139      Recent Labs  Lab 04/20/20 1452  LIPASE 11   No results for input(s): AMMONIA in the last 168 hours.   Cardiac Panel (last 3 results) No results for input(s): CKTOTAL, CKMB, TROPONINI, RELINDX in the last 72 hours.   BNP (last 3 results) No results for input(s): BNP in the last 8760 hours.    DM  labs:  HbA1C: No results for input(s): HGBA1C in the last 8760 hours.     CBG (last 3)  No results for input(s): GLUCAP in the last 72 hours.        Cultures: No results found for: SDES, SPECREQUEST, CULT, REPTSTATUS   Radiological Exams on Admission: CT Abdomen Pelvis W Contrast  Result Date: 04/20/2020 CLINICAL DATA:  Nausea vomiting abdominal pain EXAM: CT ABDOMEN AND PELVIS  WITH CONTRAST TECHNIQUE: Multidetector CT imaging of the abdomen and pelvis was performed using the standard protocol following bolus administration of intravenous contrast. CONTRAST:  80mL OMNIPAQUE IOHEXOL 300 MG/ML  SOLN COMPARISON:  None. FINDINGS: Lower chest: The visualized heart size within normal limits. No pericardial fluid/thickening. There is a tiny amount of pneumomediastinum seen at the posterior right lung base and surrounding the distal GE junction and anterior aorta. There appears to be wall thickening seen at the distal esophagus/GE junction. No definite loculated fluid collections are noted. Hepatobiliary: The liver is normal in density without focal abnormality.The main portal vein is patent. No evidence of calcified  gallstones, gallbladder wall thickening or biliary dilatation. Pancreas: Unremarkable. No pancreatic ductal dilatation or surrounding inflammatory changes. Spleen: Normal in size without focal abnormality. Adrenals/Urinary Tract: Both adrenal glands appear normal. The kidneys and collecting system appear normal without evidence of urinary tract calculus or hydronephrosis. Bladder is unremarkable. Stomach/Bowel: The stomach, small bowel, and colon are normal in appearance. No inflammatory changes, wall thickening, or obstructive findings.The appendix is normal. Vascular/Lymphatic: There are no enlarged mesenteric, retroperitoneal, or pelvic lymph nodes. No significant vascular findings are present. Reproductive: The uterus and adnexa are unremarkable. A small amount of free fluid seen within the deep cul-de-sac. Other: No evidence of abdominal wall mass or hernia. Musculoskeletal: No acute or significant osseous findings. IMPRESSION: Small amount of pneumomediastinum seen at the right posterior lung base surrounding the GE junction with diffuse wall thickening at the GE junction which could be due to esophagitis. No loculated fluid collections are noted. These results were called by telephone at the time of interpretation on 04/20/2020 at 5:31 pm to provider Dr. Stevie Kernykstra, who verbally acknowledged these results. Electronically Signed   By: Jonna ClarkBindu  Avutu M.D.   On: 04/20/2020 17:34   DG ESOPHAGUS W SINGLE CM (SOL OR THIN BA)  Result Date: 04/20/2020 CLINICAL DATA:  Vomiting today with abdominal pain and soreness. Pneumo mediastinum was demonstrated on CT abdomen and pelvis. EXAM: ESOPHOGRAM/BARIUM SWALLOW TECHNIQUE: Single contrast examination was performed using Omnipaque water-soluble contrast material. FLUOROSCOPY TIME:  Fluoroscopy Time:  1 minutes 0 seconds Radiation Exposure Index (if provided by the fluoroscopic device): 16.1 mGy Number of Acquired Spot Images: 10 COMPARISON:  CT abdomen and pelvis 04/20/2020  FINDINGS: Scout views obtained demonstrate pneumoperitoneum with soft tissue gas extending throughout the neck and upper mediastinum. Mediastinal contours appear intact. Fluoroscopic visualization with cine images obtained during active swallowing of contrast material in the upright AP and oblique positions. Normal initiation of swallowing with free flow of contrast material through the esophagus and into the stomach. The esophageal wall is regular in the esophageal folds are normal. Normal primary peristalsis with normal stripping wave and good clearance of contrast material out of the esophagus. No contrast extravasation was demonstrated. Delayed images demonstrated no residual contrast material in the esophagus and no extraluminal pooling. Limited visualization of the stomach without distention. IMPRESSION: 1. Pneumo mediastinum. 2. Normal esophagram.  No evidence of contrast extravasation. Electronically Signed   By: Burman NievesWilliam  Stevens M.D.   On: 04/20/2020 22:47   _______________________________________________________________________________________________________ Latest  Blood pressure 135/86, pulse (!) 57, temperature (!) 96.9 F (36.1 C), temperature source Oral, resp. rate 15, height 5\' 6"  (1.676 m), weight 55.4 kg, SpO2 100 %.   Review of Systems:    Pertinent positives include: ***  Constitutional:  No weight loss, night sweats, Fevers, chills, fatigue, weight loss  HEENT:  No headaches, Difficulty swallowing,Tooth/dental problems,Sore throat,  No sneezing, itching,  ear ache, nasal congestion, post nasal drip,  Cardio-vascular:  No chest pain, Orthopnea, PND, anasarca, dizziness, palpitations.no Bilateral lower extremity swelling  GI:  No heartburn, indigestion, abdominal pain, nausea, vomiting, diarrhea, change in bowel habits, loss of appetite, melena, blood in stool, hematemesis Resp:  no shortness of breath at rest. No dyspnea on exertion, No excess mucus, no productive cough, No  non-productive cough, No coughing up of blood.No change in color of mucus.No wheezing. Skin:  no rash or lesions. No jaundice GU:  no dysuria, change in color of urine, no urgency or frequency. No straining to urinate.  No flank pain.  Musculoskeletal:  No joint pain or no joint swelling. No decreased range of motion. No back pain.  Psych:  No change in mood or affect. No depression or anxiety. No memory loss.  Neuro: no localizing neurological complaints, no tingling, no weakness, no double vision, no gait abnormality, no slurred speech, no confusion  All systems reviewed and apart from HOPI all are negative _______________________________________________________________________________________________ Past Medical History:   Past Medical History:  Diagnosis Date  . Anxiety   . 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 Depressed       History reviewed. No pertinent surgical history.  Social History:  Ambulatory *** independently cane, walker  wheelchair bound, bed bound     reports that she has never smoked. She has never used smokeless tobacco. She reports current drug use. Drug: Marijuana. She reports that she does not drink alcohol.     Family History: *** Family History  Problem Relation Age of Onset  . Hypertension Mother   . Cancer Maternal Grandfather        Leukemia/lymphoma   ______________________________________________________________________________________________ Allergies: Allergies  Allergen Reactions  . Prozac [Fluoxetine Hcl] Other (See Comments)    Negative personality change  . Vyvanse [Lisdexamfetamine Dimesylate] Other (See Comments)    Negative Personality change     Prior to Admission medications   Medication Sig Start Date End Date Taking? Authorizing Provider  calcium carbonate (TUMS - DOSED IN MG ELEMENTAL CALCIUM) 500 MG chewable tablet Chew 3 tablets by mouth daily as needed for indigestion or heartburn.   Yes [provider]  CONCERTA 54 MG CR tablet Take 54 mg by mouth every morning. 04/15/20  Yes [provider]  lamoTRIgine (LAMICTAL) 150 MG tablet Take 150 mg by mouth daily. 01/06/20  Yes [provider]  Norelgestromin-Eth Estradiol Burr Medico TD) Place 1 patch onto the skin every Tuesday.   Yes [provider]  pantoprazole (PROTONIX) 40 MG tablet Take 40 mg by mouth daily. 11/04/19  Yes [provider]    ___________________________________________________________________________________________________ Physical Exam: Vitals with BMI 04/20/2020 04/20/2020 04/20/2020  Height - - -  Weight - - -  BMI - - -  Systolic 135 131 161  Diastolic 86 72 84  Pulse 57 57 61  Some encounter information is confidential and restricted. Go to Review Flowsheets activity to see all data.     1. General:  in No ***Acute distress***increased work of breathing ***complaining of severe pain****agitated * Chronically ill *well *cachectic *toxic acutely ill -appearing 2. Psychological: Alert and *** Oriented 3. Head/ENT:   Moist *** Dry Mucous Membranes                          Head Non traumatic, neck supple  Normal *** Poor Dentition 4. SKIN: normal *** decreased Skin turgor,  Skin clean Dry and intact no rash 5. Heart: Regular rate and rhythm no*** Murmur, no Rub or gallop 6. Lungs: ***Clear to auscultation bilaterally, no wheezes or crackles   7. Abdomen: Soft, ***non-tender, Non distended *** obese ***bowel sounds present 8. Lower extremities: no clubbing, cyanosis, no ***edema 9. Neurologically Grossly intact, moving all 4 extremities equally *** strength 5 out of 5 in all 4 extremities cranial nerves II through XII intact 10. MSK: Normal range of motion    Chart has been reviewed  ______________________________________________________________________________________________  Assessment/Plan  20 y.o. female with medical history significant of  anxiety     Admitted for pneumomediastinum dehydrtion  Present on Admission: . Pneumomediastinum (HCC) . Hypokalemia . Intractable nausea and vomiting . Marijuana use . Leukocytosis . Dehydration   Other plan as per orders.  DVT prophylaxis:  SCD      Code Status:    Code Status: Prior FULL CODE   per patient   I had personally discussed CODE STATUS with patient    Family Communication:   Family not at  Bedside  plan of care was discussed on the phone with *** Son, Daughter, Wife, Husband, Sister, Brother , father, mother  Disposition Plan:        To home once workup is complete and patient is stable  ***Following barriers for discharge:                            Electrolytes corrected                               Anemia corrected                             Pain controlled with PO medications                               Afebrile, white count improving able to transition to PO antibiotics                             Will need to be able to tolerate PO                            Will likely need home health, home O2, set up                           Will need consultants to evaluate patient prior to discharge                      Consults called: general surgery is aware   Admission status:  ED Disposition    ED Disposition Condition Comment   Admit  Hospital Area: [facility] [[id number]]  Level of Care: Progressive [102]  Admit to Progressive based on following criteria: CARDIOVASCULAR & THORACIC of moderate stability with acute coronary syndrome symptoms/low risk myocardial infarction/hypertensive urgency/arrhythmias/heart failure potentially compromising stability and stable post cardiovascular intervention patients.  Covid Evaluation: Confirmed COVID Negative  Diagnosis: Pneumomediastinum [facility]) [[id number]]  Admitting Physician: Therisa Doyne [[id number]]  Attending Physician: Therisa Doyne [[id number]]  Obs   Level of care progressive  tele  indefinitely please discontinue once patient no longer qualifies COVID-19 Labs    Lab Results  Component Value Date   SARSCOV2NAA NEGATIVE 04/20/2020     Precautions: admitted as   Covid Negative    PPE: Used by the provider:   N95  eye Goggles,  Gloves      Regina Contreras 04/20/2020, 10:54 PM ***  [facility]     after 2 AM please page floor coverage PA If 7AM-7PM, please contact the day team taking care of the patient using [email or web address]   Patient was evaluated in the context of the global COVID-19 pandemic, which necessitated consideration that the patient might be at risk for infection with the SARS-CoV-2 virus that causes COVID-19. Institutional protocols and algorithms that pertain to the evaluation of patients at risk for COVID-19 are in a state of rapid change based on information released by regulatory bodies including the CDC and federal and state organizations. These policies and algorithms were followed during the patient's care.

## 2020-04-20 NOTE — ED Triage Notes (Signed)
Nausea and vomiting started at 1130.  C/o abdominal pain from vomiting.

## 2020-04-20 NOTE — ED Provider Notes (Signed)
8:58 PM Patient transferred from droppage for further evaluation work-up prior to likely admission for possible perforation at GE junction with associated pneumomediastinum.  Plan of care is to get an esophagram to rule out perforation.  After this is done, anticipate patient will call general surgery to discuss plans for disposition.  To CT surgery, general surgery, or hospital service.   9:29 PM I assessed the patient who is still having some pain in her upper abdomen her nausea has improved but she would like more pain medicine.  Will order this.  I spoke to Dr. Adela Glimpse with the hospital service who recommends the plan is to get the esophagus imaging first and then determine disposition following.  After the esophagram, we will decide if she needs general surgery consultation, CT surgery, or hospitalist admit.  I spoke to radiology Dr. Gwenyth Bender at [phone] who confirms that this was the correct order and his colleagues going to come in and do the esophageal imaging.  After the images completed we will determine disposition.  The esophagram does not show active extra of the does show the pneumomediastinum.  Given the lack of active extra of, will hold on general surgery or CT surgery consultation and will admit to medicine for further monitoring.  Patient received more pain medicine and nausea medicine help with her symptoms.  She will be admitted to medicine who spoke to the previous team about the case.   Clinical Impression: 1. Gastroenteritis   2. Pneumomediastinum (HCC)     Disposition: Admit  This note was prepared with assistance of Dragon voice recognition software. Occasional wrong-word or sound-a-like substitutions may have occurred due to the inherent limitations of voice recognition software.     Paytan Recine, Canary Brim, MD 04/20/20 [phone]

## 2020-04-20 NOTE — ED Provider Notes (Signed)
[facility] EMERGENCY DEPT Provider Note   CSN: [id number] Arrival date & time: 04/20/20  1437     History Chief Complaint  Patient presents with  . Emesis  . Abdominal Pain    Regina Contreras is a 20 y.o. female.  Patient felt well until 1130 today.  Sudden onset of multiple episodes of vomiting while at work.  Associated with abdominal pain which is somewhat diffuse.  No blood in the vomit.  Just recently started with diarrhea as well.  Patient was urgent care and was referred here.  Past medical history is significant for anxiety disorder.  And depression.        Past Medical History:  Diagnosis Date  . Anxiety   . 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 Depressed     Patient Active Problem List   Diagnosis Date Noted  . Plantar wart of left foot 08/09/2018  . 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 Extrapyramidal symptom 12/04/2014  . Suicidal ideation   . Self-injurious behavior   . Major depressive disorder, recurrent episode, moderate (HCC) 11/28/2014    History reviewed. No pertinent surgical history.   OB History   No obstetric history on file.     Family History  Problem Relation Age of Onset  . Hypertension Mother   . Cancer Maternal Grandfather        Leukemia/lymphoma    Social History   Tobacco Use  . Smoking status: Never Smoker  . Smokeless tobacco: Never Used  Vaping Use  . Vaping Use: Every day  Substance Use Topics  . Alcohol use: No  . Drug use: Yes    Types: Marijuana    Comment: last night    Home Medications Prior to Admission medications   Medication Sig Start Date End Date Taking? Authorizing Provider  ibuprofen (ADVIL,MOTRIN) 600 MG tablet Take 1 tablet (600 mg total) by mouth every 8 (eight) hours as needed. Patient not taking: Reported on 05/08/2017 04/13/17   Lezlie Lye, Meda Coffee, MD  lamoTRIgine (LAMICTAL) 200 MG tablet Take 1 tablet (200 mg total) by mouth daily. 12/05/14   Thedora Hinders, MD  methylphenidate 36 MG PO CR tablet Take 54 mg by mouth daily.     [provider]  norelgestromin-ethinyl estradiol Burr Medico) 150-35 MCG/24HR transdermal patch Place 1 patch onto the skin once a week.    [provider]    Allergies    Prozac [fluoxetine hcl] and Vyvanse [lisdexamfetamine dimesylate]  Review of Systems   Review of Systems  Constitutional: Negative for chills and fever.  HENT: Negative for congestion, rhinorrhea and sore throat.   Eyes: Negative for visual disturbance.  Respiratory: Negative for cough and shortness of breath.   Cardiovascular: Negative for chest pain and leg swelling.  Gastrointestinal: Positive for abdominal pain, diarrhea, nausea and vomiting.  Genitourinary: Negative for dysuria.  Musculoskeletal: Negative for back pain and neck pain.  Skin: Negative for rash.  Neurological: Negative for dizziness, light-headedness and headaches.  Hematological: Does not bruise/bleed easily.  Psychiatric/Behavioral: Negative for confusion.    Physical Exam Updated Vital Signs Pulse 66   Temp (!) 96.9 F (36.1 C) (Oral)   Resp 16   Ht 1.676 m (5\' 6" )   Wt 56.7 kg   SpO2 100%   BMI 20.18 kg/m   Physical Exam Vitals and nursing note reviewed.  Constitutional:      General: She is not in acute distress.    Appearance: Normal appearance. She  is well-developed.  HENT:     Head: Normocephalic and atraumatic.  Eyes:     Extraocular Movements: Extraocular movements intact.     Conjunctiva/sclera: Conjunctivae normal.     Pupils: Pupils are equal, round, and reactive to light.  Cardiovascular:     Rate and Rhythm: Normal rate and regular rhythm.     Heart sounds: No murmur heard.   Pulmonary:     Effort: Pulmonary effort is normal. No respiratory distress.     Breath sounds: Normal breath sounds.  Abdominal:     Palpations: Abdomen is soft.     Tenderness: There is no abdominal tenderness.  Musculoskeletal:         General: Normal range of motion.     Cervical back: Neck supple.  Skin:    General: Skin is warm and dry.     Capillary Refill: Capillary refill takes less than 2 seconds.  Neurological:     General: No focal deficit present.     Mental Status: She is alert and oriented to person, place, and time.     Cranial Nerves: No cranial nerve deficit.     Sensory: No sensory deficit.     Motor: No weakness.     ED Results / Procedures / Treatments   Labs (all labs ordered are listed, but only abnormal results are displayed) Labs Reviewed  LIPASE, BLOOD  COMPREHENSIVE METABOLIC PANEL  CBC  URINALYSIS, ROUTINE W REFLEX MICROSCOPIC  PREGNANCY, URINE    EKG None  Radiology No results found.  Procedures Procedures  Medications Ordered in ED Medications  ondansetron (ZOFRAN) injection 4 mg (has no administration in time range)  0.9 %  sodium chloride infusion (has no administration in time range)  sodium chloride 0.9 % bolus 1,000 mL (has no administration in time range)  HYDROmorphone (DILAUDID) injection 1 mg (has no administration in time range)  ondansetron (ZOFRAN) 4 MG/2ML injection (4 mg  Given 04/20/20 1452)    ED Course  I have reviewed the triage vital signs and the nursing notes.  Pertinent labs & imaging results that were available during my care of the patient were reviewed by me and considered in my medical decision making (see chart for details).    MDM Rules/Calculators/A&P                          Based on the acute presentation of vomiting and diarrhea most likely a gastroenteritis.  But patient seems to be in a fair amount of distress.  And with the abdominal pain we will go ahead and do CT scan of the abdomen.  Lab wise will get CBC complete metabolic panel lipase urinalysis urine drug screen.  Will give patient IV Zofran and will also give dose of hydromorphone.  Patient will receive 1 L of fluid and then IV hydration maintenance.  Patient will be  turned over to evening physician. Final Clinical Impression(s) / ED Diagnoses Final diagnoses:  None    Rx / DC Orders ED Discharge Orders    None       Vanetta Mulders, MD 04/20/20 1526

## 2020-04-20 NOTE — ED Notes (Signed)
Patient instructed to go to [facility] ED. Patient's IV wrapped with Coban. Patient's mother driving patient to [facility].  Consulting civil engineer at [facility] called and informed of patient's transfer and that MD Pfeiffer accepting. Charge RN reports understanding.

## 2020-04-21 ENCOUNTER — Observation Stay (HOSPITAL_COMMUNITY): Payer: 59

## 2020-04-21 DIAGNOSIS — J982 Interstitial emphysema: Secondary | ICD-10-CM

## 2020-04-21 DIAGNOSIS — D72829 Elevated white blood cell count, unspecified: Secondary | ICD-10-CM

## 2020-04-21 DIAGNOSIS — R112 Nausea with vomiting, unspecified: Secondary | ICD-10-CM | POA: Diagnosis not present

## 2020-04-21 DIAGNOSIS — D649 Anemia, unspecified: Secondary | ICD-10-CM | POA: Diagnosis present

## 2020-04-21 DIAGNOSIS — F1290 Cannabis use, unspecified, uncomplicated: Secondary | ICD-10-CM

## 2020-04-21 DIAGNOSIS — D509 Iron deficiency anemia, unspecified: Secondary | ICD-10-CM | POA: Diagnosis not present

## 2020-04-21 LAB — CBC
HCT: 31.8 % — ABNORMAL LOW (ref 36.0–46.0)
Hemoglobin: 10 g/dL — ABNORMAL LOW (ref 12.0–15.0)
MCH: 25.5 pg — ABNORMAL LOW (ref 26.0–34.0)
MCHC: 31.4 g/dL (ref 30.0–36.0)
MCV: 81.1 fL (ref 80.0–100.0)
Platelets: 331 10*3/uL (ref 150–400)
RBC: 3.92 MIL/uL (ref 3.87–5.11)
RDW: 16.6 % — ABNORMAL HIGH (ref 11.5–15.5)
WBC: 12.7 10*3/uL — ABNORMAL HIGH (ref 4.0–10.5)
nRBC: 0 % (ref 0.0–0.2)

## 2020-04-21 LAB — RETICULOCYTES
Immature Retic Fract: 13 % (ref 2.3–15.9)
RBC.: 4.02 MIL/uL (ref 3.87–5.11)
Retic Count, Absolute: 42.2 10*3/uL (ref 19.0–186.0)
Retic Ct Pct: 1.1 % (ref 0.4–3.1)

## 2020-04-21 LAB — COMPREHENSIVE METABOLIC PANEL
ALT: 14 U/L (ref 0–44)
AST: 23 U/L (ref 15–41)
Albumin: 3.2 g/dL — ABNORMAL LOW (ref 3.5–5.0)
Alkaline Phosphatase: 43 U/L (ref 38–126)
Anion gap: 7 (ref 5–15)
BUN: 12 mg/dL (ref 6–20)
CO2: 24 mmol/L (ref 22–32)
Calcium: 8.4 mg/dL — ABNORMAL LOW (ref 8.9–10.3)
Chloride: 104 mmol/L (ref 98–111)
Creatinine, Ser: 0.75 mg/dL (ref 0.44–1.00)
GFR, Estimated: >60 mL/min (ref >60)
Glucose, Bld: 115 mg/dL — ABNORMAL HIGH (ref 70–99)
Potassium: 3.6 mmol/L (ref 3.5–5.1)
Sodium: 135 mmol/L (ref 135–145)
Total Bilirubin: 0.4 mg/dL (ref 0.3–1.2)
Total Protein: 5.9 g/dL — ABNORMAL LOW (ref 6.5–8.1)

## 2020-04-21 LAB — BASIC METABOLIC PANEL
Anion gap: 8 (ref 5–15)
BUN: 12 mg/dL (ref 6–20)
CO2: 24 mmol/L (ref 22–32)
Calcium: 8.7 mg/dL — ABNORMAL LOW (ref 8.9–10.3)
Chloride: 104 mmol/L (ref 98–111)
Creatinine, Ser: 0.82 mg/dL (ref 0.44–1.00)
GFR, Estimated: >60 mL/min (ref >60)
Glucose, Bld: 110 mg/dL — ABNORMAL HIGH (ref 70–99)
Potassium: 3.9 mmol/L (ref 3.5–5.1)
Sodium: 136 mmol/L (ref 135–145)

## 2020-04-21 LAB — FOLATE: Folate: 16.1 ng/mL (ref >5.9)

## 2020-04-21 LAB — IRON AND TIBC
Iron: 32 ug/dL (ref 28–170)
Saturation Ratios: 6 % — ABNORMAL LOW (ref 10.4–31.8)
TIBC: 535 ug/dL — ABNORMAL HIGH (ref 250–450)
UIBC: 503 ug/dL

## 2020-04-21 LAB — CBC WITH DIFFERENTIAL/PLATELET
Abs Immature Granulocytes: 0.04 10*3/uL (ref 0.00–0.07)
Basophils Absolute: 0 10*3/uL (ref 0.0–0.1)
Basophils Relative: 0 %
Eosinophils Absolute: 0 10*3/uL (ref 0.0–0.5)
Eosinophils Relative: 0 %
HCT: 28.4 % — ABNORMAL LOW (ref 36.0–46.0)
Hemoglobin: 8.9 g/dL — ABNORMAL LOW (ref 12.0–15.0)
Immature Granulocytes: 0 %
Lymphocytes Relative: 23 %
Lymphs Abs: 2.7 10*3/uL (ref 0.7–4.0)
MCH: 25.4 pg — ABNORMAL LOW (ref 26.0–34.0)
MCHC: 31.3 g/dL (ref 30.0–36.0)
MCV: 81.1 fL (ref 80.0–100.0)
Monocytes Absolute: 0.8 10*3/uL (ref 0.1–1.0)
Monocytes Relative: 7 %
Neutro Abs: 8 10*3/uL — ABNORMAL HIGH (ref 1.7–7.7)
Neutrophils Relative %: 70 %
Platelets: 294 10*3/uL (ref 150–400)
RBC: 3.5 MIL/uL — ABNORMAL LOW (ref 3.87–5.11)
RDW: 16.5 % — ABNORMAL HIGH (ref 11.5–15.5)
WBC: 11.6 10*3/uL — ABNORMAL HIGH (ref 4.0–10.5)
nRBC: 0 % (ref 0.0–0.2)

## 2020-04-21 LAB — VITAMIN B12: Vitamin B-12: 308 pg/mL (ref 180–914)

## 2020-04-21 LAB — MAGNESIUM
Magnesium: 1.9 mg/dL (ref 1.7–2.4)
Magnesium: 1.8 mg/dL (ref 1.7–2.4)

## 2020-04-21 LAB — PHOSPHORUS: Phosphorus: 3 mg/dL (ref 2.5–4.6)

## 2020-04-21 LAB — FERRITIN: Ferritin: 5 ng/mL — ABNORMAL LOW (ref 11–307)

## 2020-04-21 LAB — CK: Total CK: 250 U/L — ABNORMAL HIGH (ref 38–234)

## 2020-04-21 LAB — HIV ANTIBODY (ROUTINE TESTING W REFLEX): HIV Screen 4th Generation wRfx: NONREACTIVE

## 2020-04-21 LAB — TSH: TSH: 1.046 u[IU]/mL (ref 0.350–4.500)

## 2020-04-21 MED ORDER — POTASSIUM CHLORIDE 10 MEQ/100ML IV SOLN
10.0000 meq | INTRAVENOUS | Status: AC
  Administered 2020-04-21 (×2): 10 meq via INTRAVENOUS
  Filled 2020-04-21: qty 100

## 2020-04-21 MED ORDER — ACETAMINOPHEN 650 MG RE SUPP: 650.0000 mg | Freq: Four times a day (QID) | RECTAL | Status: DC | PRN

## 2020-04-21 MED ORDER — ACETAMINOPHEN 325 MG PO TABS: 650.0000 mg | ORAL_TABLET | Freq: Four times a day (QID) | ORAL | Status: DC | PRN

## 2020-04-21 MED ORDER — PANTOPRAZOLE SODIUM 40 MG IV SOLR
40.0000 mg | Freq: Two times a day (BID) | INTRAVENOUS | Status: DC
  Administered 2020-04-21 (×2): 40 mg via INTRAVENOUS
  Filled 2020-04-21 (×2): qty 40

## 2020-04-21 MED ORDER — ONDANSETRON 4 MG PO TBDP: 4.0000 mg | ORAL_TABLET | Freq: Three times a day (TID) | ORAL | 0 refills | Status: AC | PRN

## 2020-04-21 MED ORDER — SODIUM CHLORIDE 0.9 % IV SOLN
2.0000 g | INTRAVENOUS | Status: DC
  Filled 2020-04-21: qty 20

## 2020-04-21 MED ORDER — ONDANSETRON HCL 4 MG/2ML IJ SOLN
4.0000 mg | Freq: Four times a day (QID) | INTRAMUSCULAR | Status: DC | PRN
  Administered 2020-04-21: 4 mg via INTRAVENOUS
  Filled 2020-04-21 (×2): qty 2

## 2020-04-21 MED ORDER — HYDROMORPHONE HCL 1 MG/ML IJ SOLN
0.5000 mg | INTRAMUSCULAR | Status: DC | PRN
  Administered 2020-04-21: 0.5 mg via INTRAVENOUS
  Filled 2020-04-21: qty 1

## 2020-04-21 MED ORDER — HYDROCODONE-ACETAMINOPHEN 5-325 MG PO TABS
1.0000 | ORAL_TABLET | ORAL | Status: DC | PRN
  Administered 2020-04-21: 1 via ORAL
  Filled 2020-04-21: qty 1

## 2020-04-21 MED ORDER — LAMOTRIGINE 25 MG PO TABS
150.0000 mg | ORAL_TABLET | Freq: Every day | ORAL | Status: DC
  Administered 2020-04-21: 150 mg via ORAL
  Filled 2020-04-21: qty 2

## 2020-04-21 NOTE — Discharge Summary (Signed)
Discharge Summary  Regina Contreras UUV:[id number] DOB: October 12, 2000  PCP: Porfirio Oar, PA  Admit date: 04/20/2020 Discharge date: 04/21/2020  Time spent: 30 mins  Recommendations for Outpatient Follow-up:  1. Follow-up with PCP in 1 week   Discharge Diagnoses:  Active Hospital Problems   Diagnosis Date Noted  . Anemia 04/21/2020  . Pneumomediastinum (HCC) 04/20/2020  . Hypokalemia 04/20/2020  . Intractable nausea and vomiting 04/20/2020  . Marijuana use 04/20/2020  . Leukocytosis 04/20/2020  . Dehydration 04/20/2020    Resolved Hospital Problems  No resolved problems to display.    Discharge Condition: Stable  Diet recommendation: Regular diet  Vitals:   04/21/20 0727 04/21/20 1100  BP: 113/72 112/86  Pulse: (!) 50   Resp:  15  Temp: 98.2 F (36.8 C) 98.5 F (36.9 C)  SpO2: 100% 100%    History of present illness:  Regina Contreras is a 20 y.o. female with medical history significant of anxiety. Presented with abdominal pain which is somewhat diffuse, nausea, dry heaves, and severe vomiting. Patient initially presented to urgent care but was sent to ED given severity. Was seen at [facility] noted to have vital signs stable but CT scan showed small amount of pneumomediastinum at GE junction. EDP spoke to general surgery, was recommended to transfer to [facility] for upper GI swallow study. Of note, pt vapes and smokes marijuana.  Patient admitted for further management.    Today, patient reports feeling much better, denies further retching, vomiting, denies any abdominal pain, fever/chills, chest pain, shortness of breath, cough.  Patient very eager to be discharged.  Patient was able to tolerate diet.  Advised patient to quit vaping and smoking marijuana and follow-up with PCP.     Hospital Course:  Active Problems:   Pneumomediastinum (HCC)   Hypokalemia   Intractable nausea and vomiting   Marijuana use   Leukocytosis   Dehydration    Anemia   Pneumomediastinum CT scan showed small amount of pneumomediastinum, likely due to significant retching Esophagram was normal, no evidence of contrast extravasation Repeat chest x-ray showed mild superior pneumomediastinum extending into cervical region Discussed with Dr. Dorris Fetch CT surgeon on 04/21/2020, reviewed all studies and notes. CXR is unremarkable and nothing is needed. She just needs to follow up with her PCP and to quit smoking Follow-up with PCP  Intractable nausea/vomiting Resolving, was able to tolerate regular diet D/C on prn zofran, continue PPI  Iron def anemia Patient reports prior history of heavy menstrual periods, but since she has been on birth control, has improved Continue follow-up with PCP, refused oral iron supplementation  Leukocytosis Likely reactive from pneumomediastinum Follow-up from PCP  Polysubstance abuse Advised to quit vaping and marijuana     Estimated body mass index is 19.71 kg/m as calculated from the following:   Height as of this encounter: 5\' 6"  (1.676 m).   Weight as of this encounter: 55.4 kg.    Procedures:  None  Consultations:  Discussed with CT surgeon  Discharge Exam: BP 112/86   Pulse (!) 50   Temp 98.5 F (36.9 C) (Oral)   Resp 15   Ht 5\' 6"  (1.676 m)   Wt 55.4 kg   SpO2 100%   BMI 19.71 kg/m   General: NAD Cardiovascular: S1, S2 present Respiratory: CTA B    Discharge Instructions You were cared for by a hospitalist during your hospital stay. If you have any questions about your discharge medications or the care you received while  you were in the hospital after you are discharged, you can call the unit and asked to speak with the hospitalist on call if the hospitalist that took care of you is not available. Once you are discharged, your primary care physician will handle any further medical issues. Please note that NO REFILLS for any discharge medications will be authorized once you are  discharged, as it is imperative that you return to your primary care physician (or establish a relationship with a primary care physician if you do not have one) for your aftercare needs so that they can reassess your need for medications and monitor your lab values.  Discharge Instructions    Diet - low sodium heart healthy   Complete by: As directed    Increase activity slowly   Complete by: As directed    No wound care   Complete by: As directed      Allergies as of 04/21/2020      Reactions   Prozac [fluoxetine Hcl] Other (See Comments)   Negative personality change   Vyvanse [lisdexamfetamine Dimesylate] Other (See Comments)   Negative Personality change      Medication List    TAKE these medications   calcium carbonate 500 MG chewable tablet Commonly known as: TUMS - dosed in mg elemental calcium Chew 3 tablets by mouth daily as needed for indigestion or heartburn.   Concerta 54 MG CR tablet Generic drug: methylphenidate Take 54 mg by mouth every morning.   lamoTRIgine 150 MG tablet Commonly known as: LAMICTAL Take 150 mg by mouth daily.   ondansetron 4 MG disintegrating tablet Commonly known as: Zofran ODT Take 1 tablet (4 mg total) by mouth every 8 (eight) hours as needed for nausea or vomiting.   pantoprazole 40 MG tablet Commonly known as: PROTONIX Take 40 mg by mouth daily.   Burr Medico TD Place 1 patch onto the skin every Tuesday.      Allergies  Allergen Reactions  . Prozac [Fluoxetine Hcl] Other (See Comments)    Negative personality change  . Vyvanse [Lisdexamfetamine Dimesylate] Other (See Comments)    Negative Personality change    Follow-up Information    Porfirio Oar, PA. Schedule an appointment as soon as possible for a visit in 1 week(s).   Specialty: Family Medicine Contact information: [address] [place] [address] [phone]                The results of significant diagnostics from this  hospitalization (including imaging, microbiology, ancillary and laboratory) are listed below for reference.    Significant Diagnostic Studies: CT Abdomen Pelvis W Contrast  Result Date: 04/20/2020 CLINICAL DATA:  Nausea vomiting abdominal pain EXAM: CT ABDOMEN AND PELVIS WITH CONTRAST TECHNIQUE: Multidetector CT imaging of the abdomen and pelvis was performed using the standard protocol following bolus administration of intravenous contrast. CONTRAST:  44mL OMNIPAQUE IOHEXOL 300 MG/ML  SOLN COMPARISON:  None. FINDINGS: Lower chest: The visualized heart size within normal limits. No pericardial fluid/thickening. There is a tiny amount of pneumomediastinum seen at the posterior right lung base and surrounding the distal GE junction and anterior aorta. There appears to be wall thickening seen at the distal esophagus/GE junction. No definite loculated fluid collections are noted. Hepatobiliary: The liver is normal in density without focal abnormality.The main portal vein is patent. No evidence of calcified gallstones, gallbladder wall thickening or biliary dilatation. Pancreas: Unremarkable. No pancreatic ductal dilatation or surrounding inflammatory changes. Spleen: Normal in size without focal abnormality.  Adrenals/Urinary Tract: Both adrenal glands appear normal. The kidneys and collecting system appear normal without evidence of urinary tract calculus or hydronephrosis. Bladder is unremarkable. Stomach/Bowel: The stomach, small bowel, and colon are normal in appearance. No inflammatory changes, wall thickening, or obstructive findings.The appendix is normal. Vascular/Lymphatic: There are no enlarged mesenteric, retroperitoneal, or pelvic lymph nodes. No significant vascular findings are present. Reproductive: The uterus and adnexa are unremarkable. A small amount of free fluid seen within the deep cul-de-sac. Other: No evidence of abdominal wall mass or hernia. Musculoskeletal: No acute or significant osseous  findings. IMPRESSION: Small amount of pneumomediastinum seen at the right posterior lung base surrounding the GE junction with diffuse wall thickening at the GE junction which could be due to esophagitis. No loculated fluid collections are noted. These results were called by telephone at the time of interpretation on 04/20/2020 at 5:31 pm to provider Dr. Stevie Kernykstra, who verbally acknowledged these results. Electronically Signed   By: Jonna ClarkBindu  Avutu M.D.   On: 04/20/2020 17:34   DG Chest Port 1 View  Result Date: 04/21/2020 CLINICAL DATA:  Pneumomediastinum EXAM: PORTABLE CHEST 1 VIEW COMPARISON:  Portable exam at 1041 hrs compared to 04/13/2017 FINDINGS: Normal heart size, mediastinal contours, and pulmonary vascularity. Pneumomediastinum identified at the superior mediastinum extending into the cervical region and LEFT infraclavicular region. Lungs clear. No acute infiltrate, pleural effusion, or pneumothorax. Osseous structures unremarkable. IMPRESSION: Mild superior pneumomediastinum extending into cervical region. Electronically Signed   By: Ulyses SouthwardMark  Boles M.D.   On: 04/21/2020 11:14   DG ESOPHAGUS W SINGLE CM (SOL OR THIN BA)  Result Date: 04/20/2020 CLINICAL DATA:  Vomiting today with abdominal pain and soreness. Pneumo mediastinum was demonstrated on CT abdomen and pelvis. EXAM: ESOPHOGRAM/BARIUM SWALLOW TECHNIQUE: Single contrast examination was performed using Omnipaque water-soluble contrast material. FLUOROSCOPY TIME:  Fluoroscopy Time:  1 minutes 0 seconds Radiation Exposure Index (if provided by the fluoroscopic device): 16.1 mGy Number of Acquired Spot Images: 10 COMPARISON:  CT abdomen and pelvis 04/20/2020 FINDINGS: Scout views obtained demonstrate pneumoperitoneum with soft tissue gas extending throughout the neck and upper mediastinum. Mediastinal contours appear intact. Fluoroscopic visualization with cine images obtained during active swallowing of contrast material in the upright AP and oblique  positions. Normal initiation of swallowing with free flow of contrast material through the esophagus and into the stomach. The esophageal wall is regular in the esophageal folds are normal. Normal primary peristalsis with normal stripping wave and good clearance of contrast material out of the esophagus. No contrast extravasation was demonstrated. Delayed images demonstrated no residual contrast material in the esophagus and no extraluminal pooling. Limited visualization of the stomach without distention. IMPRESSION: 1. Pneumo mediastinum. 2. Normal esophagram.  No evidence of contrast extravasation. Electronically Signed   By: Burman NievesWilliam  Stevens M.D.   On: 04/20/2020 22:47    Microbiology: Recent Results (from the past 240 hour(s))  Resp Panel by RT-PCR (Flu A&B, Covid) Nasopharyngeal Swab     Status: None   Collection Time: 04/20/20  7:19 PM   Specimen: Nasopharyngeal Swab; Nasopharyngeal(NP) swabs in vial transport medium  Result Value Ref Range Status   SARS Coronavirus 2 by RT PCR NEGATIVE NEGATIVE Final    Comment: (NOTE) SARS-CoV-2 target nucleic acids are NOT DETECTED.  The SARS-CoV-2 RNA is generally detectable in upper respiratory specimens during the acute phase of infection. The lowest concentration of SARS-CoV-2 viral copies this assay can detect is 138 copies/mL. A negative result does not preclude SARS-Cov-2 infection and should not  be used as the sole basis for treatment or other patient management decisions. A negative result may occur with  improper specimen collection/handling, submission of specimen other than nasopharyngeal swab, presence of viral mutation(s) within the areas targeted by this assay, and inadequate number of viral copies(<138 copies/mL). A negative result must be combined with clinical observations, patient history, and epidemiological information. The expected result is Negative.  Fact Sheet for Patients:   BloggerCourse.com  Fact Sheet for Healthcare Providers:  SeriousBroker.it  This test is no t yet approved or cleared by the [place] FDA and  has been authorized for detection and/or diagnosis of SARS-CoV-2 by FDA under an Emergency Use Authorization (EUA). This EUA will remain  in effect (meaning this test can be used) for the duration of the COVID-19 declaration under Section 564(b)(1) of the Act, 21 U.S.C.section 360bbb-3(b)(1), unless the authorization is terminated  or revoked sooner.       Influenza A by PCR NEGATIVE NEGATIVE Final   Influenza B by PCR NEGATIVE NEGATIVE Final    Comment: (NOTE) The Xpert Xpress SARS-CoV-2/FLU/RSV plus assay is intended as an aid in the diagnosis of influenza from Nasopharyngeal swab specimens and should not be used as a sole basis for treatment. Nasal washings and aspirates are unacceptable for Xpert Xpress SARS-CoV-2/FLU/RSV testing.  Fact Sheet for Patients: BloggerCourse.com  Fact Sheet for Healthcare Providers: SeriousBroker.it  This test is not yet approved or cleared by the [place] FDA and has been authorized for detection and/or diagnosis of SARS-CoV-2 by FDA under an Emergency Use Authorization (EUA). This EUA will remain in effect (meaning this test can be used) for the duration of the COVID-19 declaration under Section 564(b)(1) of the Act, 21 U.S.C. section 360bbb-3(b)(1), unless the authorization is terminated or revoked.  Performed at [facility]      Labs: Basic Metabolic Panel: Recent Labs  Lab 04/20/20 1452 04/21/20 0246 04/21/20 0343  NA 140 136 135  K 3.2* 3.9 3.6  CL 105 104 104  CO2 21* 24 24  GLUCOSE 174* 110* 115*  BUN 13 12 12   CREATININE 0.89 0.82 0.75  CALCIUM 9.8 8.7* 8.4*  MG  --  1.9 1.8  PHOS  --   --  3.0   Liver Function Tests: Recent Labs  Lab 04/20/20 1452  04/21/20 0343  AST 22 23  ALT 14 14  ALKPHOS 54 43  BILITOT 0.4 0.4  PROT 7.7 5.9*  ALBUMIN 4.7 3.2*   Recent Labs  Lab 04/20/20 1452  LIPASE 11   No results for input(s): AMMONIA in the last 168 hours. CBC: Recent Labs  Lab 04/20/20 1452 04/21/20 0246 04/21/20 0343  WBC 16.8* 12.7* 11.6*  NEUTROABS  --   --  8.0*  HGB 10.8* 10.0* 8.9*  HCT 35.1* 31.8* 28.4*  MCV 78.0* 81.1 81.1  PLT 452* 331 294   Cardiac Enzymes: Recent Labs  Lab 04/21/20 0246  CKTOTAL 250*   BNP: BNP (last 3 results) No results for input(s): BNP in the last 8760 hours.  ProBNP (last 3 results) No results for input(s): PROBNP in the last 8760 hours.  CBG: No results for input(s): GLUCAP in the last 168 hours.     Signed:  06/21/20, MD [facility] 04/21/2020, 1:29 PM

## 2020-04-21 NOTE — Plan of Care (Signed)
  Problem: Education: Goal: Knowledge of General Education information will improve Description: Including pain rating scale, medication(s)/side effects and non-pharmacologic comfort measures Outcome: Not Progressing   Problem: Health Behavior/Discharge Planning: Goal: Ability to manage health-related needs will improve Outcome: Not Progressing   Problem: Clinical Measurements: Goal: Ability to maintain clinical measurements within normal limits will improve Outcome: Not Progressing Goal: Will remain free from infection Outcome: Not Progressing Goal: Diagnostic test results will improve Outcome: Not Progressing Goal: Respiratory complications will improve Outcome: Not Progressing Goal: Cardiovascular complication will be avoided Outcome: Not Progressing   Problem: Activity: Goal: Risk for activity intolerance will decrease Outcome: Not Progressing   Problem: Nutrition: Goal: Adequate nutrition will be maintained Outcome: Not Progressing   Problem: Coping: Goal: Level of anxiety will decrease Outcome: Not Progressing   Problem: Elimination: Goal: Will not experience complications related to bowel motility Outcome: Not Progressing Goal: Will not experience complications related to urinary retention Outcome: Not Progressing   Problem: Safety: Goal: Ability to remain free from injury will improve Outcome: Not Progressing   Problem: Skin Integrity: Goal: Risk for impaired skin integrity will decrease Outcome: Not Progressing   Problem: Nutrition Goal: Patient maintains adequate hydration Outcome: Not Progressing Goal: Patient maintains weight Outcome: Not Progressing Goal: Patient/Family demonstrates understanding of diet Outcome: Not Progressing Goal: Patient/Family independently completes tube feeding Outcome: Not Progressing Goal: Patient will have no more than 5 lb weight change during LOS Outcome: Not Progressing Goal: Patient will utilize adaptive techniques  to administer nutrition Outcome: Not Progressing Goal: Patient will verbalize dietary restrictions Outcome: Not Progressing

## 2020-04-21 NOTE — Progress Notes (Signed)
Upon entering room patient is asleep. Woke patient up for morning assessment. Once awake, patient is tearful stating she needs her antipsychotic medications and something for pain/nausea. States that her neck and chest are sore. Administered ordered medications. Patient assisted with wash up at bedside. Will continue to monitor.

## 2021-06-10 ENCOUNTER — Ambulatory Visit: Payer: Medicaid Other

## 2021-06-17 ENCOUNTER — Ambulatory Visit: Payer: Medicaid Other

## 2021-08-25 IMAGING — RF DG ESOPHAGUS
9 of 10 series · 14 of 20 positions shown · IV contrast (omnipaque)
Comparison: CT abdomen and pelvis 04/20/2020

CLINICAL DATA: Vomiting today with abdominal pain and soreness.
Pneumo mediastinum was demonstrated on CT abdomen and pelvis.

EXAM:
ESOPHOGRAM/BARIUM SWALLOW
TECHNIQUE: Single contrast examination was performed using Omnipaque
water-soluble contrast material.
FLUOROSCOPY TIME:  Fluoroscopy Time:  1 minutes 0 seconds
Radiation Exposure Index (if provided by the fluoroscopic device):
16.1 mGy
Number of Acquired Spot Images: 10

[Series 1: fluoro_barium 2fps_bw · 0.17mm/px · 2 of 3 frames shown (1 of 9)]
[frame 1/3]
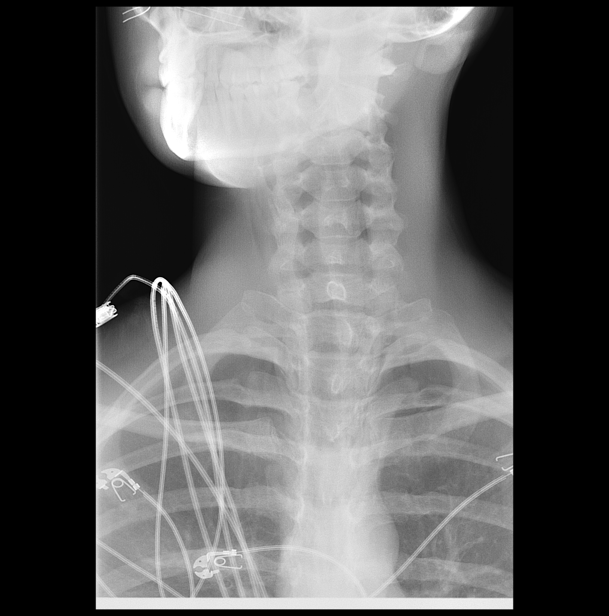
[frame 3/3]
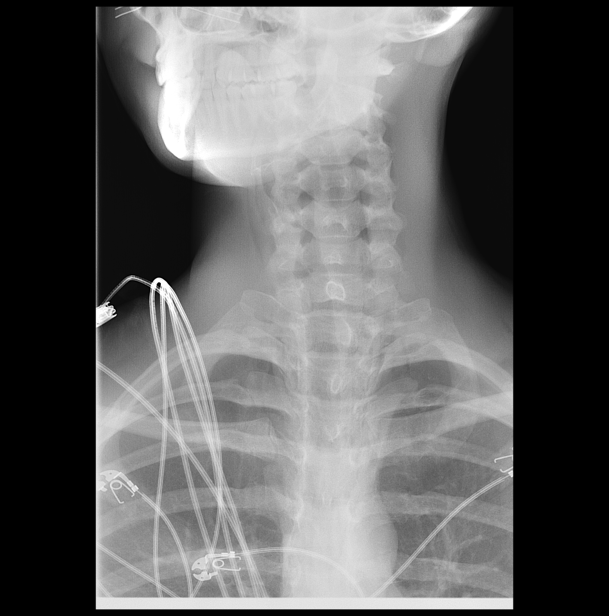

[Series 2: fluoro_barium 2fps_bw · 0.17mm/px · 1 of 2 frames shown (2 of 9)]
[frame 1/2]
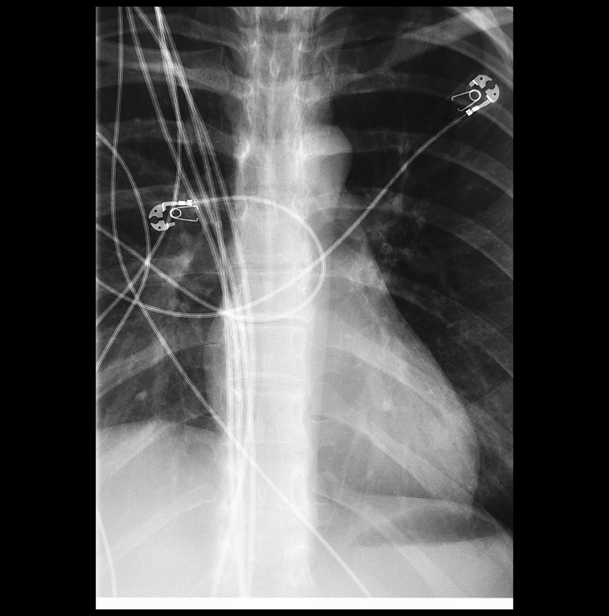

[Series 3: fluoro_barium 2fps_bw · 0.17mm/px · 1 of 1 slices shown (3 of 9)]
[im 1/1]
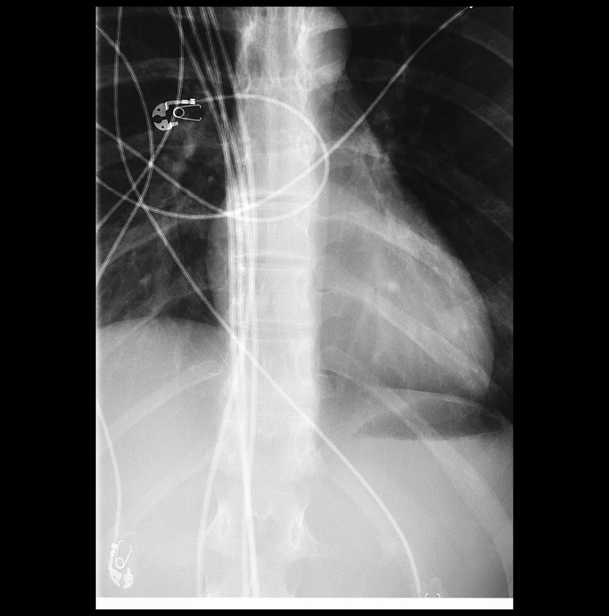

[Series 4: fluoro_barium 2fps_bw · 0.17mm/px · 2 of 2 frames shown (4 of 9)]
[frame 1/2]
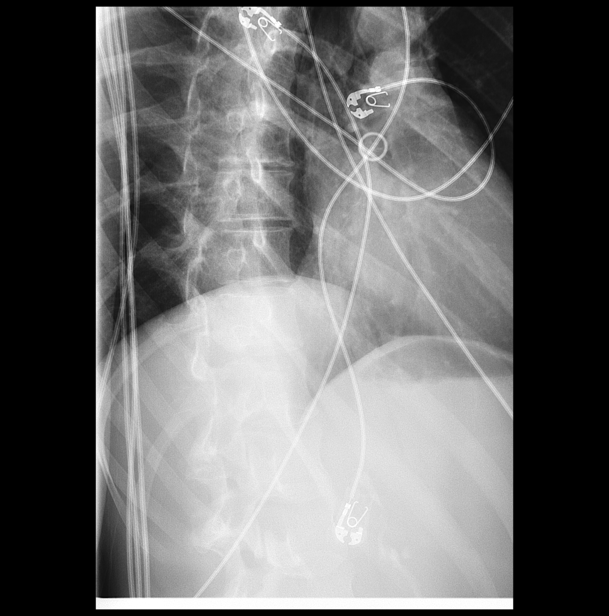
[frame 2/2]
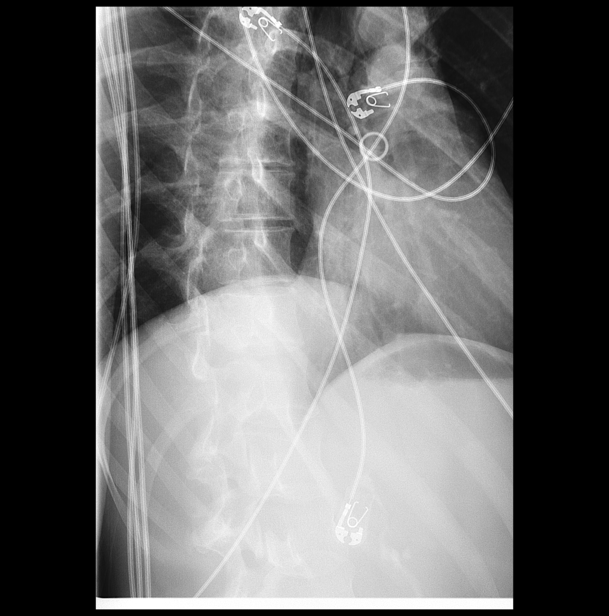

[Series 6: fluoro_barium 2fps_bw · 0.17mm/px · 2 of 16 frames shown (5 of 9)]
[frame 3/16]
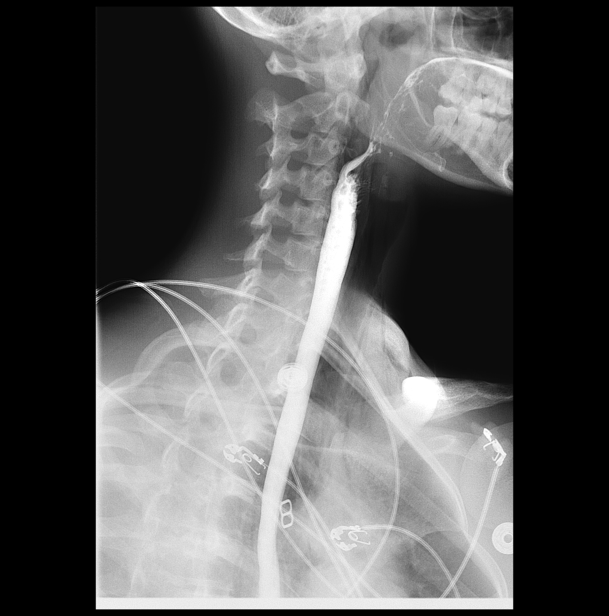
[frame 9/16]
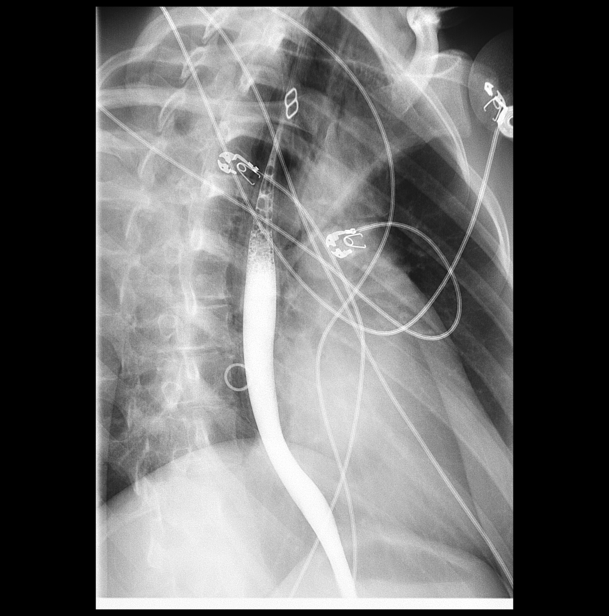

[Series 7: fluoro_barium 2fps_bw · 0.17mm/px · 3 of 13 frames shown (6 of 9)]
[frame 2/13]
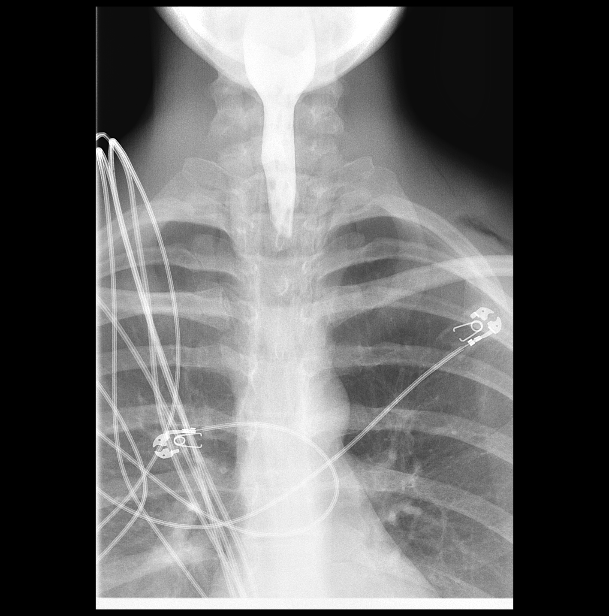
[frame 7/13]
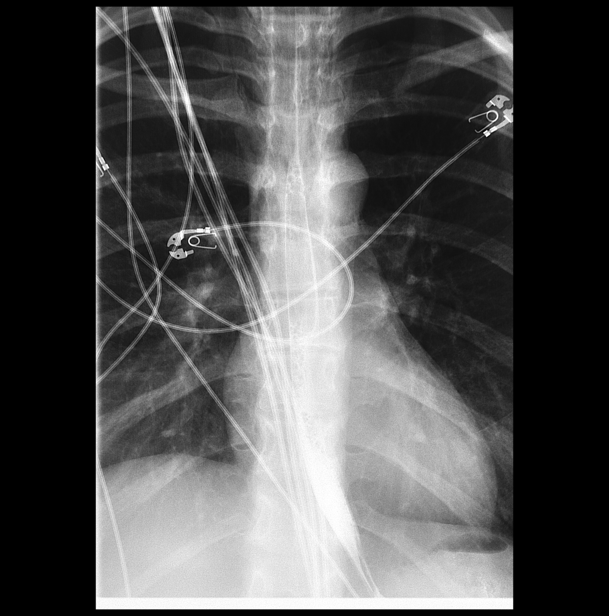
[frame 12/13]
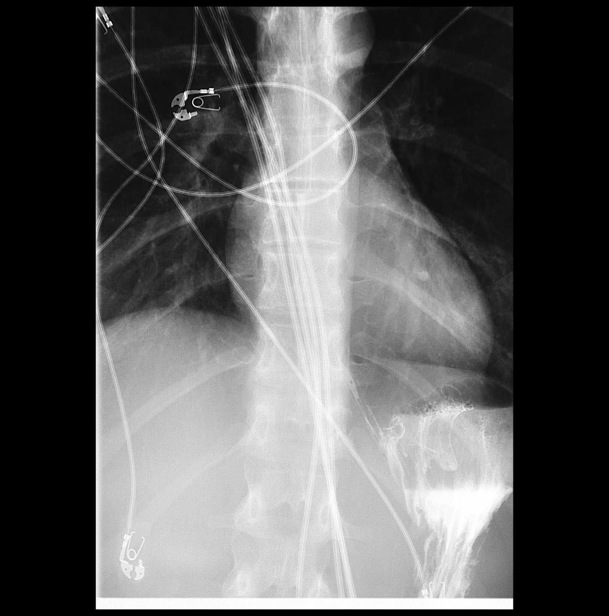

[Series 8: fluoro_barium 2fps_bw · 0.17mm/px · 1 of 1 slices shown (7 of 9)]
[im 1/1]
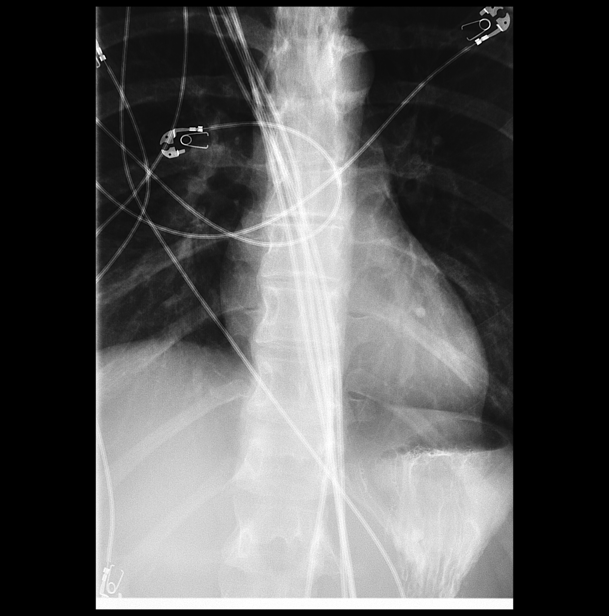

[Series 9: fluoro_barium 2fps_bw · 0.17mm/px · 1 of 1 slices shown (8 of 9)]
[im 1/1]
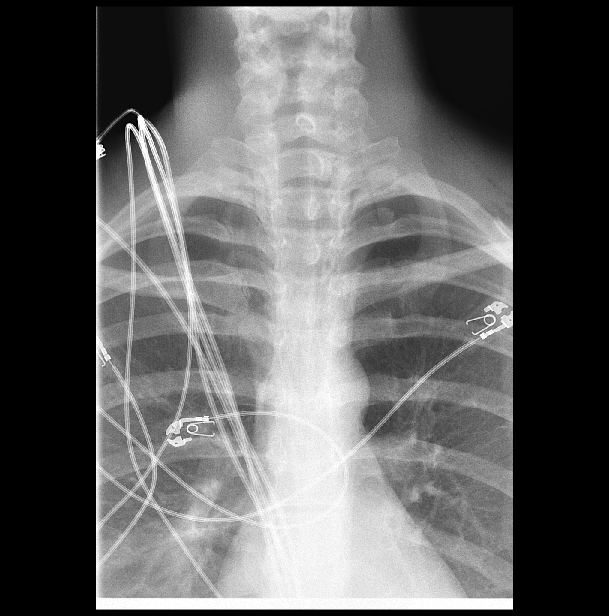

[Series 10: fluoro_barium 2fps_bw · 0.17mm/px · 1 of 2 frames shown (9 of 9)]
[frame 2/2]
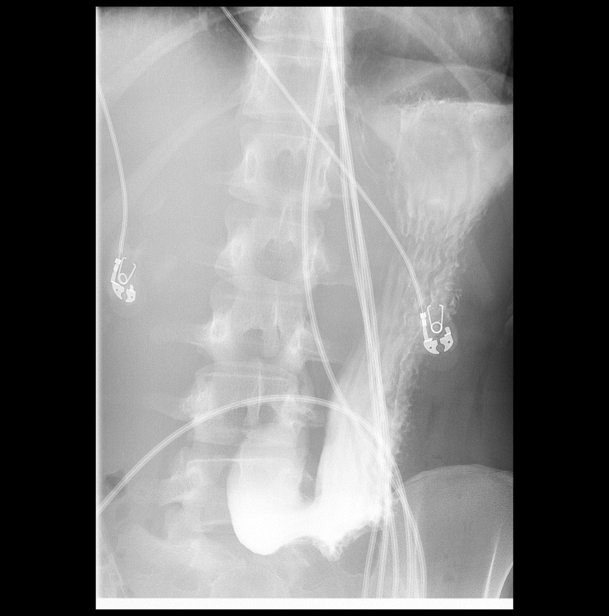

[14 of 20 positions shown; findings below may reference images not displayed]

FINDINGS: Scout views obtained demonstrate pneumoperitoneum with soft tissue
gas extending throughout the neck and upper mediastinum. Mediastinal
contours appear intact.

Fluoroscopic visualization with cine images obtained during active
swallowing of contrast material in the upright AP and oblique
positions. Normal initiation of swallowing with free flow of
contrast material through the esophagus and into the stomach. The
esophageal wall is regular in the esophageal folds are normal.
Normal primary peristalsis with normal stripping wave and good
clearance of contrast material out of the esophagus. No contrast
extravasation was demonstrated. Delayed images demonstrated no
residual contrast material in the esophagus and no extraluminal
pooling. Limited visualization of the stomach without distention.
IMPRESSION: 1. Pneumo mediastinum.
2. Normal esophagram.  No evidence of contrast extravasation.

## 2023-04-10 ENCOUNTER — Emergency Department (HOSPITAL_BASED_OUTPATIENT_CLINIC_OR_DEPARTMENT_OTHER)

## 2023-04-10 ENCOUNTER — Encounter (HOSPITAL_BASED_OUTPATIENT_CLINIC_OR_DEPARTMENT_OTHER): Payer: Self-pay | Admitting: Emergency Medicine

## 2023-04-10 ENCOUNTER — Emergency Department (HOSPITAL_BASED_OUTPATIENT_CLINIC_OR_DEPARTMENT_OTHER)
Admission: EM | Admit: 2023-04-10 | Discharge: 2023-04-10 | Disposition: A | Discharge status: Home / Self Care | Attending: Emergency Medicine | Admitting: Emergency Medicine

## 2023-04-10 ENCOUNTER — Other Ambulatory Visit: Payer: Self-pay

## 2023-04-10 DIAGNOSIS — Z23 Encounter for immunization: Secondary | ICD-10-CM | POA: Insufficient documentation

## 2023-04-10 DIAGNOSIS — S0081XA Abrasion of other part of head, initial encounter: Secondary | ICD-10-CM | POA: Insufficient documentation

## 2023-04-10 DIAGNOSIS — Y92410 Unspecified street and highway as the place of occurrence of the external cause: Secondary | ICD-10-CM | POA: Insufficient documentation

## 2023-04-10 DIAGNOSIS — R519 Headache, unspecified: Secondary | ICD-10-CM | POA: Diagnosis present

## 2023-04-10 LAB — PREGNANCY, URINE: Preg Test, Ur: NEGATIVE

## 2023-04-10 MED ORDER — NAPROXEN 500 MG PO TABS: 500.0000 mg | ORAL_TABLET | Freq: Two times a day (BID) | ORAL | 0 refills | Status: AC

## 2023-04-10 MED ORDER — ACETAMINOPHEN 500 MG PO TABS
1000.0000 mg | ORAL_TABLET | Freq: Once | ORAL | Status: AC
  Administered 2023-04-10: 1000 mg via ORAL
  Filled 2023-04-10: qty 2

## 2023-04-10 MED ORDER — TETANUS-DIPHTH-ACELL PERTUSSIS 5-2.5-18.5 LF-MCG/0.5 IM SUSY
0.5000 mL | PREFILLED_SYRINGE | Freq: Once | INTRAMUSCULAR | Status: AC
  Administered 2023-04-10: 0.5 mL via INTRAMUSCULAR
  Filled 2023-04-10: qty 0.5

## 2023-04-10 MED ORDER — LIDOCAINE 5 % EX PTCH: 1.0000 | MEDICATED_PATCH | CUTANEOUS | 0 refills | Status: AC
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
